# Patient Record
Sex: Male | Born: 1990
Health system: Southern US, Community
[De-identification: ages and names within clinical notes are randomized; demographics above are authoritative.]

## PROBLEM LIST (undated history)

## (undated) DIAGNOSIS — R011 Cardiac murmur, unspecified: Secondary | ICD-10-CM

## (undated) DIAGNOSIS — F988 Other specified behavioral and emotional disorders with onset usually occurring in childhood and adolescence: Secondary | ICD-10-CM

## (undated) DIAGNOSIS — F419 Anxiety disorder, unspecified: Secondary | ICD-10-CM

## (undated) HISTORY — PX: NO PAST SURGERIES: SHX2092

## (undated) HISTORY — DX: Other specified behavioral and emotional disorders with onset usually occurring in childhood and adolescence: F98.8

## (undated) HISTORY — PX: WISDOM TOOTH EXTRACTION: SHX21

## (undated) HISTORY — DX: Cardiac murmur, unspecified: R01.1

## (undated) HISTORY — DX: Anxiety disorder, unspecified: F41.9

---
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
## 2009-08-09 ENCOUNTER — Ambulatory Visit: Payer: Self-pay | Admitting: Family Medicine

## 2009-10-16 ENCOUNTER — Ambulatory Visit: Payer: Self-pay | Admitting: Internal Medicine

## 2009-11-15 ENCOUNTER — Telehealth (INDEPENDENT_AMBULATORY_CARE_PROVIDER_SITE_OTHER): Payer: Self-pay | Admitting: *Deleted

## 2010-01-09 ENCOUNTER — Telehealth (INDEPENDENT_AMBULATORY_CARE_PROVIDER_SITE_OTHER): Payer: Self-pay | Admitting: *Deleted

## 2010-02-13 ENCOUNTER — Telehealth (INDEPENDENT_AMBULATORY_CARE_PROVIDER_SITE_OTHER): Payer: Self-pay | Admitting: *Deleted

## 2010-02-14 ENCOUNTER — Telehealth: Payer: Self-pay | Admitting: Internal Medicine

## 2010-04-03 ENCOUNTER — Telehealth (INDEPENDENT_AMBULATORY_CARE_PROVIDER_SITE_OTHER): Payer: Self-pay | Admitting: *Deleted

## 2010-04-04 ENCOUNTER — Telehealth (INDEPENDENT_AMBULATORY_CARE_PROVIDER_SITE_OTHER): Payer: Self-pay | Admitting: *Deleted

## 2010-10-16 NOTE — Progress Notes (Signed)
Summary: ADDERRAL PRESCRIPTION  Phone Note Call from Patient Call back at [facility] [phone]   Caller: Patient Summary of Call: NEED ADDERRAL 20 ---MOM PATTY MORGAN WILL PICK UP FOR PATIENT  ADVISED IT WILL BE READY AFTER 3PM ON [facility] Initial call taken by: Jerolyn Shin,  April 03, 2010 2:51 PM  Follow-up for Phone Call        will place up front needs appt before additional refills. Army Fossa CMA  April 03, 2010 3:25 PM     Prescriptions: ADDERALL 10 MG TABS (AMPHETAMINE-DEXTROAMPHETAMINE) 2 by mouth two times a day  #0 x 0   Entered by:   Army Fossa CMA   Authorized by:   Nolon Rod. Paz MD   Signed by:   Army Fossa CMA on 04/03/2010   Method used:   Print then Give to Patient   RxID:   [id number]

## 2010-10-16 NOTE — Progress Notes (Signed)
Summary: Refill Request ADDERALL  Phone Note Refill Request Message from:  Patient on Feb 13, 2010 11:42 AM  Refills Requested: Medication #1:  ADDERALL 20 MG TABS 1 by mouth two times a day.   Dosage confirmed as above?Dosage Confirmed   Supply Requested: 1 month Call at [phone] when ready   Method Requested: Pick up at Office Next Appointment Scheduled: none Initial call taken by: Harold Barban,  Feb 13, 2010 11:42 AM  Follow-up for Phone Call        LAST OV 10/16/09, LAST REFILL #60 X 0 ON 01/10/10 .Kandice Hams  Feb 13, 2010 3:07 PM  Follow-up by: Kandice Hams,  Feb 13, 2010 3:07 PM  Additional Follow-up for Phone Call Additional follow up Details #1::        pt informed rx will be ready for pickup .Kandice Hams  Feb 13, 2010 3:18 PM  Additional Follow-up by: Kandice Hams,  Feb 13, 2010 3:18 PM    Prescriptions: ADDERALL 20 MG TABS (AMPHETAMINE-DEXTROAMPHETAMINE) 1 by mouth two times a day  #60 x 0   Entered by:   Kandice Hams   Authorized by:   Nolon Rod. Paz MD   Signed by:   Kandice Hams on 02/13/2010   Method used:   Print then Give to Patient   RxID:   [id number]

## 2010-10-16 NOTE — Progress Notes (Signed)
  Phone Note Call from Patient   Summary of Call: needs to rx for adderall, quanity was not on Rx. Army Fossa CMA  April 04, 2010 4:03 PM     Prescriptions: ADDERALL 10 MG TABS (AMPHETAMINE-DEXTROAMPHETAMINE) 2 by mouth two times a day  #30 x 0   Entered by:   Army Fossa CMA   Authorized by:   Nolon Rod. Paz MD   Signed by:   Army Fossa CMA on 04/04/2010   Method used:   Print then Give to Patient   RxID:   [id number]   Appended Document:  New quanity. Pt takes more than 30 takes 2 by two times a day.   Clinical Lists Changes  Medications: Rx of ADDERALL 10 MG TABS (AMPHETAMINE-DEXTROAMPHETAMINE) 2 by mouth two times a day;  #120 x 0;  Signed;  Entered by: Army Fossa CMA;  Authorized by: Nolon Rod Paz MD;  Method used: Print then Give to Patient    Prescriptions: ADDERALL 10 MG TABS (AMPHETAMINE-DEXTROAMPHETAMINE) 2 by mouth two times a day  #120 x 0   Entered by:   Army Fossa CMA   Authorized by:   Nolon Rod. Paz MD   Signed by:   Army Fossa CMA on 04/04/2010   Method used:   Print then Give to Patient   RxID:   [id number]

## 2010-10-16 NOTE — Progress Notes (Signed)
Summary: FYI ADDERALL 20 MG ON BACK ORDER  Phone Note From Pharmacy   Caller: Theodoro Grist from [facility] [phone] Summary of Call: Adderall 20mg  not available on back order  pt takes 20mg  1 po bid ;  can we change to 10mg  and change quantity and directions?  Spoke with pharmacist informed OK Adderall 10mg  2 by mouth two times a day #120  MED CHANGED IN MED LIST  Initial call taken by: Kandice Hams,  February 14, 2010 2:38 PM  Follow-up for Phone Call        agree [place] E. Deirdre Gryder MD  February 15, 2010 8:30 AM     New/Updated Medications: ADDERALL 10 MG TABS (AMPHETAMINE-DEXTROAMPHETAMINE) 2 by mouth two times a day

## 2010-10-16 NOTE — Progress Notes (Signed)
Summary: refill adderall  Phone Note Refill Request Call back at [phone] Message from:  Patient  Refills Requested: Medication #1:  ADDERALL 20 MG TABS 1 by mouth two times a day. please call when ready - patient mom will pick up  Next Appointment Scheduled: last appt [id number] - no appt scheduled Initial call taken by: Okey Regal [place],  January 09, 2010 4:13 PM    Prescriptions: ADDERALL 20 MG TABS (AMPHETAMINE-DEXTROAMPHETAMINE) 1 by mouth two times a day  #60 x 0   Entered by:   Shary Decamp   Authorized by:   Nolon Rod. Paz MD   Signed by:   Shary Decamp on 01/10/2010   Method used:   Print then Give to Patient   RxID:   [id number]

## 2010-10-16 NOTE — Progress Notes (Signed)
Summary: adderall rx  Phone Note Refill Request Call back at [phone] Message from:  Patient mom  Refills Requested: Medication #1:  ADDERALL 20 MG TABS 1 by mouth two times a day. last ov 10/16/09, last refill #60 x 0 on 10/16/09  Initial call taken by: Kandice Hams,  November 15, 2009 1:38 PM  Follow-up for Phone Call        pt mom informed rx will be ready for pickup today .Kandice Hams  November 15, 2009 1:46 PM  Follow-up by: Kandice Hams,  November 15, 2009 1:46 PM    Prescriptions: ADDERALL 20 MG TABS (AMPHETAMINE-DEXTROAMPHETAMINE) 1 by mouth two times a day  #60 x 0   Entered by:   Kandice Hams   Authorized by:   Nolon Rod. Paz MD   Signed by:   Kandice Hams on 11/15/2009   Method used:   Print then Give to Patient   RxID:   [id number]

## 2010-10-16 NOTE — Assessment & Plan Note (Signed)
Summary: fup on meds//ccm   Vital Signs:  Patient profile:   20 year old male Height:      69.5 inches Weight:      163.2 pounds BMI:     23.84 Pulse rate:   70 / minute BP sitting:   162 / 80  Vitals Entered By: Shary Decamp (October 16, 2009 2:04 PM) CC: rov   History of Present Illness: here for f/u doing well on Adderall some days takes two in the morning and one in the afternoon, other days  one p.o. b.i.d.  Current Medications (verified): 1)  Adderall 20 Mg Tabs (Amphetamine-Dextroamphetamine) .Marland Kitchen.. 1 By Mouth Two Times A Day  Allergies (verified): No Known Drug Allergies  Past History:  Past Medical History: ADD  Past Surgical History: no major   Social History: Single lives with mother, going to college at [employer] marijuana sometimes-- weekends  tobacco-- rarely  Review of Systems Psych:  Denies anxiety and depression; sleeps well for the most part .  Physical Exam  General:  alert, well-developed, and well-nourished.   Lungs:  Normal respiratory effort, chest expands symmetrically. Lungs are clear to auscultation, no crackles or wheezes. Heart:  Normal rate and regular rhythm. S1 and S2 normal without gallop, murmur, click, rub or other extra sounds. Psych:  Cognition and judgment appear intact. Alert and cooperative with normal attention span and concentration.  not anxious appearing and not depressed appearing.     Impression & Recommendations:  Problem # 1:  ADD (ICD-314.00) symptoms well controlled is okay to take two in the morning and one in the afternoon some days but at the same time I would like him to skip meds  on Sundays.  Hopefully 60 tabs will  last  a month but if he needs more,  he is to let me know Will call for prescription monthly, his mother could pick it up follow up in 6 months BP initially slightly elevated, BP repeated by me manually and it was 120/70  Complete Medication List: 1)  Adderall 20 Mg Tabs  (Amphetamine-dextroamphetamine) .Marland Kitchen.. 1 by mouth two times a day  Patient Instructions: 1)  Please schedule a follow-up appointment in 6 months .  Prescriptions: ADDERALL 20 MG TABS (AMPHETAMINE-DEXTROAMPHETAMINE) 1 by mouth two times a day  #60 x 0   Entered by:   Shary Decamp   Authorized by:   Nolon Rod. Khianna Blazina MD   Signed by:   Shary Decamp on 10/16/2009   Method used:   Print then Give to Patient   RxID:   [id number]
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
## 2012-10-02 ENCOUNTER — Encounter: Payer: Self-pay | Admitting: Internal Medicine

## 2012-10-02 ENCOUNTER — Ambulatory Visit (INDEPENDENT_AMBULATORY_CARE_PROVIDER_SITE_OTHER): Payer: Self-pay | Admitting: Internal Medicine

## 2012-10-02 ENCOUNTER — Encounter: Payer: Self-pay | Admitting: Lab

## 2012-10-02 VITALS — BP 122/78 | HR 66 | Temp 98.2°F | Ht 71.0 in | Wt 154.0 lb

## 2012-10-02 DIAGNOSIS — R011 Cardiac murmur, unspecified: Secondary | ICD-10-CM

## 2012-10-02 DIAGNOSIS — F988 Other specified behavioral and emotional disorders with onset usually occurring in childhood and adolescence: Secondary | ICD-10-CM

## 2012-10-02 DIAGNOSIS — F419 Anxiety disorder, unspecified: Secondary | ICD-10-CM

## 2012-10-02 DIAGNOSIS — F411 Generalized anxiety disorder: Secondary | ICD-10-CM

## 2012-10-02 HISTORY — DX: Anxiety disorder, unspecified: F41.9

## 2012-10-02 MED ORDER — CLONAZEPAM 0.5 MG PO TABS: 0.5000 mg | ORAL_TABLET | Freq: Three times a day (TID) | ORAL | Status: DC | PRN

## 2012-10-02 MED ORDER — AMPHETAMINE-DEXTROAMPHETAMINE 30 MG PO TABS: 30.0000 mg | ORAL_TABLET | Freq: Two times a day (BID) | ORAL | Status: DC

## 2012-10-02 NOTE — Assessment & Plan Note (Addendum)
Currently taking clonazepam 3 times a day with good control of symptoms, old records reviewed, that is indeed the dose  he was getting from previous provider. On return to the office, will discuss other medication such as SSRIs

## 2012-10-02 NOTE — Patient Instructions (Addendum)
Call for an Adderall refilled at least 5 days in advance. Next visit here 3-4 months

## 2012-10-02 NOTE — Assessment & Plan Note (Signed)
Workup in 2008 negative, I don't hear a murmur today.

## 2012-10-02 NOTE — Progress Notes (Signed)
  Subjective:    Patient ID: Darius Foster, male    DOB: 1991-01-13, 22 y.o.   MRN: [id number]  HPI Here to get reestablished, last visit about 3 years ago. Since the last time he was here, he graduated from college, currently studying in [place]. For the last 3 years, he has been under the care of off a nurse practitioner in his college, he was taking Adderall 30 mg twice a day, at some point dose was increased to 30 mg 3 times a day. With current dose symptoms are well-controlled. He also has developed anxiety, currently taking clonazepam 3 tablets daily. With current medicines, symptoms are well-controlled.  Past Medical History  Diagnosis Date  . ADD   . CARDIAC MURMUR     ECHO (-) 2008, saw cards "innocent murmur"  . Anxiety 10/02/2012   Past Surgical History  Procedure Date  . No past surgeries    History   Social History  . Marital Status: Single    Spouse Name: N/A    Number of Children: 0  . Years of Education: N/A   Occupational History  . student     Social History Main Topics  . Smoking status: Current Every Day Smoker  . Smokeless tobacco: Never Used  . Alcohol Use: No     Comment: socially   . Drug Use: No  . Sexually Active: Not on file   Other Topics Concern  . 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 No chest pain or shortness of breath Occasional difficulty with sleeping, usually melatonin OTC helps.     Objective:   Physical Exam General -- alert, well-developed, and well-nourished.   Neck --no thyromegaly  Lungs -- normal respiratory effort, no intercostal retractions, no accessory muscle use, and normal breath sounds.   Heart-- normal rate, regular rhythm, no murmur, and no gallop.   Extremities-- no pretibial edema bilaterally Neurologic-- alert & oriented X3 and strength normal in all extremities. Psych-- Cognition and judgment  appear intact. Alert and cooperative with normal attention span and concentration.  not anxious appearing and not depressed appearing.        Assessment & Plan:  Today , I spent more than 30  min with the patient,   counseling, and  Reviewing records from another provider

## 2012-10-02 NOTE — Assessment & Plan Note (Addendum)
Long history of ADHD, currently taking Adderall 30 mg 3 times a day. Notes from his previous provider reviewed, at some point the  other provider expressed concern about taking more than 60 mg daily. I concur with his concerns and explained to the pt I am willing to go up to 60 mg daily. Rec.  referral to psychiatry if he feels a higher dose is warranted. At this point he declined and we agreed to prescribe 30 mg twice a day. he will sign an agreement for controlled substances. Will call for refills, mom willpick up his prescription.

## 2012-10-04 ENCOUNTER — Encounter: Payer: Self-pay | Admitting: Internal Medicine

## 2012-11-10 ENCOUNTER — Telehealth: Payer: Self-pay | Admitting: Internal Medicine

## 2012-11-10 MED ORDER — AMPHETAMINE-DEXTROAMPHETAMINE 30 MG PO TABS: 30.0000 mg | ORAL_TABLET | Freq: Two times a day (BID) | ORAL | Status: DC

## 2012-11-10 NOTE — Telephone Encounter (Signed)
Pt called requesting rx for adderall. He states his mother is going to pick this up for him bc she will be visiting him in [place] tomorrow. Patient states he filled out forms for her to be able to pick up his rx, but I do not see this in his chart.

## 2012-11-10 NOTE — Telephone Encounter (Signed)
Done

## 2012-11-10 NOTE — Telephone Encounter (Signed)
Ok to refill? Last OV 1.17.14 Last filled 1.17.14 

## 2012-11-12 ENCOUNTER — Encounter: Payer: Self-pay | Admitting: Internal Medicine

## 2012-12-15 ENCOUNTER — Telehealth: Payer: Self-pay | Admitting: *Deleted

## 2012-12-15 ENCOUNTER — Telehealth: Payer: Self-pay | Admitting: Internal Medicine

## 2012-12-15 MED ORDER — AMPHETAMINE-DEXTROAMPHETAMINE 30 MG PO TABS: 30.0000 mg | ORAL_TABLET | Freq: Two times a day (BID) | ORAL | Status: DC

## 2012-12-15 MED ORDER — CLONAZEPAM 0.5 MG PO TABS: 0.5000 mg | ORAL_TABLET | Freq: Three times a day (TID) | ORAL | Status: DC | PRN

## 2012-12-15 NOTE — Telephone Encounter (Signed)
Refill done.  Pt made aware rx is ready to be picked up at front desk.

## 2012-12-15 NOTE — Telephone Encounter (Signed)
Patient called requesting rx for adderall 30 mg and clonazepam 0.5mg . Patient will come pick up both. CB# [phone]

## 2012-12-15 NOTE — Telephone Encounter (Signed)
Prior Auth approved 11-17-12-11-17-13, approval letter scan to chart

## 2013-01-14 ENCOUNTER — Telehealth: Payer: Self-pay | Admitting: Internal Medicine

## 2013-01-14 NOTE — Telephone Encounter (Signed)
OK to refill adderall 30mg ? Last OV 1.17.14 Last filled 4.1.14

## 2013-01-14 NOTE — Telephone Encounter (Signed)
Ok to refill? Last OV 1.17.14 Last filled 1.4.14  Clonazepam 0.5mg  was filled on 4.1.14 w/ 1 refill. Pt should not need a refill on this med.

## 2013-01-14 NOTE — Telephone Encounter (Signed)
Pt called to get a refill on his adderall medicine and clonazepam medicine. He would like to pick it up here. thanks

## 2013-01-14 NOTE — Telephone Encounter (Signed)
Agree, too early

## 2013-01-15 MED ORDER — AMPHETAMINE-DEXTROAMPHETAMINE 30 MG PO TABS: 30.0000 mg | ORAL_TABLET | Freq: Two times a day (BID) | ORAL | Status: DC

## 2013-01-15 NOTE — Telephone Encounter (Signed)
Pt made aware rx is ready to be picked up at front desk.  

## 2013-01-15 NOTE — Telephone Encounter (Signed)
ok 

## 2013-02-01 ENCOUNTER — Ambulatory Visit (INDEPENDENT_AMBULATORY_CARE_PROVIDER_SITE_OTHER): Payer: BC Managed Care – PPO | Admitting: Internal Medicine

## 2013-02-01 ENCOUNTER — Encounter: Payer: Self-pay | Admitting: Internal Medicine

## 2013-02-01 VITALS — BP 128/78 | HR 78 | Temp 98.7°F | Wt 152.0 lb

## 2013-02-01 DIAGNOSIS — F411 Generalized anxiety disorder: Secondary | ICD-10-CM

## 2013-02-01 DIAGNOSIS — F988 Other specified behavioral and emotional disorders with onset usually occurring in childhood and adolescence: Secondary | ICD-10-CM

## 2013-02-01 DIAGNOSIS — F419 Anxiety disorder, unspecified: Secondary | ICD-10-CM

## 2013-02-01 NOTE — Assessment & Plan Note (Signed)
Long history of anxiety described as panic attacks. Today we took about SSRIs, he does not recall taking such medications in the past. Currently symptoms are very well controlled, on average takes one clonazepam daily, sometimes needs  Additional 1 or 2 tablets. Since symptoms are  well controlled, we will continue with clonazepam for now.

## 2013-02-01 NOTE — Progress Notes (Signed)
  Subjective:    Patient ID: Darius Foster, male    DOB: [birth year], 22 y.o.   MRN: [id number]  HPI Routine office visit Good medication compliance, symptoms well controlled. He lives in [place] but is moving back to [place] for the summer where he got a job.  Past Medical History  Diagnosis Date  . ADD   . CARDIAC MURMUR     ECHO (-) 2008, saw cards "innocent murmur"  . Anxiety 10/02/2012    Past Surgical History  Procedure Laterality Date  . No past surgeries     History   Social History  . Marital Status: Single    Spouse Name: N/A    Number of Children: 0  . Years of Education: N/A   Occupational History  . student -- welding    Social History Main Topics  . Smoking status: Current Every Day Smoker  . Smokeless tobacco: Never Used  . Alcohol Use: No     Comment: socially   . Drug Use: No  . Sexually Active: Not on file   Other Topics Concern  . 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 Sleeps well. Has history of anxiety, panic attacks. Uses clonazepam daily one tablet, sometimes needs one or 2 additional tablets.    Objective:   Physical Exam BP 128/78  Pulse 78  Temp(Src) 98.7 F (37.1 C) (Oral)  Wt 152 lb (68.947 kg)  BMI 21.21 kg/m2  SpO2 97%  General -- alert, well-developed  Neurologic-- alert & oriented X3 and strength normal in all extremities. Psych-- Cognition and judgment appear intact. Alert and cooperative with normal attention span and concentration.  not anxious appearing and not depressed appearing.       Assessment & Plan:

## 2013-02-01 NOTE — Assessment & Plan Note (Signed)
Well-controlled, refill medications when necessary.

## 2013-02-17 ENCOUNTER — Telehealth: Payer: Self-pay | Admitting: Internal Medicine

## 2013-02-17 NOTE — Telephone Encounter (Signed)
Patient states okay for mother Rushie Goltz to pu Rx

## 2013-02-19 ENCOUNTER — Telehealth: Payer: Self-pay | Admitting: Internal Medicine

## 2013-02-19 MED ORDER — CLONAZEPAM 0.5 MG PO TABS: 0.5000 mg | ORAL_TABLET | Freq: Three times a day (TID) | ORAL | Status: DC | PRN

## 2013-02-19 MED ORDER — AMPHETAMINE-DEXTROAMPHETAMINE 30 MG PO TABS: 30.0000 mg | ORAL_TABLET | Freq: Two times a day (BID) | ORAL | Status: DC

## 2013-02-19 NOTE — Telephone Encounter (Signed)
Patient is calling to see when his refills of Adderall Rx and Clonazepam will be ready for pick-up. States that he is going out of town today and wanted to pick-up before leaving.

## 2013-02-19 NOTE — Telephone Encounter (Signed)
Pt made aware rx is ready to be picked up at front desk.  

## 2013-02-19 NOTE — Telephone Encounter (Signed)
Ok to refill? Last OV 5.19.14 Last filled 5.2.14

## 2013-02-19 NOTE — Telephone Encounter (Signed)
Done

## 2013-03-17 ENCOUNTER — Telehealth: Payer: Self-pay | Admitting: Internal Medicine

## 2013-03-17 MED ORDER — AMPHETAMINE-DEXTROAMPHETAMINE 30 MG PO TABS: 30.0000 mg | ORAL_TABLET | Freq: Two times a day (BID) | ORAL | Status: DC

## 2013-03-17 NOTE — Telephone Encounter (Signed)
Ok for #60, no refills 

## 2013-03-17 NOTE — Telephone Encounter (Signed)
Pt aware Rx ready for pick up 

## 2013-03-17 NOTE — Telephone Encounter (Signed)
Patient is calling in requesting a refill on his Adderall Rx. Please call when ready for pick up.

## 2013-03-17 NOTE — Telephone Encounter (Signed)
.  Last OV 02-01-13, Last refilled 02-19-13 #60

## 2013-04-14 ENCOUNTER — Telehealth: Payer: Self-pay | Admitting: Internal Medicine

## 2013-04-14 MED ORDER — AMPHETAMINE-DEXTROAMPHETAMINE 30 MG PO TABS: 30.0000 mg | ORAL_TABLET | Freq: Two times a day (BID) | ORAL | Status: DC

## 2013-04-14 NOTE — Telephone Encounter (Signed)
Pt called to get a refill on his two medication      amphetamine-dextroamphetamine (ADDERALL) 30 MG tablet   clonazePAM (KLONOPIN) 0.5 MG tablet  thanks

## 2013-04-14 NOTE — Telephone Encounter (Signed)
Got #90 and one refill clonazepam last month, on average he takes one tablet daily (Sometimes 2 or 3), It is too early to refill. Okay to refill Adderall. Please obtain a UDS

## 2013-04-14 NOTE — Telephone Encounter (Signed)
Ok to refill medications? Last OV 5.19.14 Adderall last filled by Dr. Beverely Low 7.2.14 #60 no refills Clonazepam last filled 6.6.14 #90 with 1 refill Contract on file Low Risk as of 1.17.14 Pt. Due for repeat UDS

## 2013-04-14 NOTE — Telephone Encounter (Signed)
Spoke with patient, made aware adderall rx ready for pickup. Verbalized understanding of clonazepam denial. Front desk made aware of need for UDS.

## 2013-04-20 ENCOUNTER — Telehealth: Payer: Self-pay | Admitting: Internal Medicine

## 2013-04-20 MED ORDER — CLONAZEPAM 0.5 MG PO TABS: 0.5000 mg | ORAL_TABLET | Freq: Three times a day (TID) | ORAL | Status: DC | PRN

## 2013-04-20 NOTE — Telephone Encounter (Signed)
Patient is out of his Clonazepam Rx. He says the 1 refill from his last prescription was used last month. Going out of town and would like to go pick up today.

## 2013-04-20 NOTE — Telephone Encounter (Signed)
Ok to refill clonazepam?  Last OV 5.19.14 Last filled 6.6.14 #90 with 1 refill Contract on file, low risk. Due for UDS

## 2013-04-20 NOTE — Telephone Encounter (Signed)
Please collect a  UDS at the time he picks up Clonazepam Rx  #90 no refills

## 2013-04-21 NOTE — Telephone Encounter (Addendum)
Prescribed clonazepam #90 and one refill 02/19/2013 Prescribe adderall 03/17/2013 UDS showed no clonazepam or Adderall 04/14/2013 He is high risk: Advise patient I won't be able to continue prescribing these medications.

## 2013-04-21 NOTE — Telephone Encounter (Signed)
Spoke with patient, he is currently in [place], [place] for school and is going to board a plane this afternoon to go on vacation to [place]. Wanted to know if we could send rx to [facility]. Please advise.

## 2013-04-21 NOTE — Telephone Encounter (Signed)
Pt. Made aware rx ready for pick up and front desk made aware of need for UDS.

## 2013-04-22 NOTE — Telephone Encounter (Signed)
Spoke with patient, explained information below. Pt. Verbalized understanding. rx shredded per protocol since MD will not be providing refills.

## 2013-04-27 ENCOUNTER — Telehealth: Payer: Self-pay | Admitting: *Deleted

## 2013-04-27 NOTE — Telephone Encounter (Signed)
Spoke with patients mother who is very upset that we will no longer prescribe patients Adderall and Klonopin. Explained to mother the UDS was negative for both drugs even though patient was receiving Rx's regularly. Due to that fact, we could no longer prescribe these medications. Patients mother became very angry stating "this is ridiculous, he probably just ran out of the medicine." Mother then demanded to speak with Dr. Drue Novel and I advised that he was seeing patients at this time and may not be able to respond to my message until after hours. Mother then went on to ask if her son signed anything that gave [place] permission to screen him and I affirmed that we did have a controlled substance agreement in place which outlines our expectations for all patients taking any controlled substance. Mother again stated "I will wait to hear from Dr. Drue Novel" and hung up. Mom is Lala Lund and cell # [phone]

## 2013-04-27 NOTE — Telephone Encounter (Signed)
Based on the UDS, I cannot continue prescribing medication. Not much I can do about it

## 2013-04-28 NOTE — Telephone Encounter (Signed)
Spoke with mother who is requesting to speak with Dr. Drue Novel personally, I advised she could make an appt WITH her son present to discuss this. Son must be present for appt due to HIPPA guidelines.

## 2013-05-05 ENCOUNTER — Encounter: Payer: Self-pay | Admitting: Internal Medicine

## 2018-08-26 DIAGNOSIS — Z5181 Encounter for therapeutic drug level monitoring: Secondary | ICD-10-CM | POA: Diagnosis not present

## 2018-08-26 DIAGNOSIS — F419 Anxiety disorder, unspecified: Secondary | ICD-10-CM | POA: Diagnosis not present

## 2018-08-26 DIAGNOSIS — F909 Attention-deficit hyperactivity disorder, unspecified type: Secondary | ICD-10-CM | POA: Diagnosis not present

## 2018-12-02 DIAGNOSIS — Z1329 Encounter for screening for other suspected endocrine disorder: Secondary | ICD-10-CM | POA: Diagnosis not present

## 2018-12-02 DIAGNOSIS — Z0110 Encounter for examination of ears and hearing without abnormal findings: Secondary | ICD-10-CM | POA: Diagnosis not present

## 2018-12-02 DIAGNOSIS — F909 Attention-deficit hyperactivity disorder, unspecified type: Secondary | ICD-10-CM | POA: Diagnosis not present

## 2018-12-02 DIAGNOSIS — F419 Anxiety disorder, unspecified: Secondary | ICD-10-CM | POA: Diagnosis not present

## 2018-12-02 DIAGNOSIS — Z131 Encounter for screening for diabetes mellitus: Secondary | ICD-10-CM | POA: Diagnosis not present

## 2018-12-02 DIAGNOSIS — Z0000 Encounter for general adult medical examination without abnormal findings: Secondary | ICD-10-CM | POA: Diagnosis not present

## 2019-02-11 DIAGNOSIS — F419 Anxiety disorder, unspecified: Secondary | ICD-10-CM | POA: Diagnosis not present

## 2019-02-11 DIAGNOSIS — F909 Attention-deficit hyperactivity disorder, unspecified type: Secondary | ICD-10-CM | POA: Diagnosis not present

## 2019-04-16 ENCOUNTER — Telehealth: Payer: Self-pay

## 2019-04-16 NOTE — Telephone Encounter (Signed)
Questions for Screening COVID-19  Symptom onset: no  Travel or Contacts:on  During this illness, did/does the patient experience any of the following symptoms? Fever >100.76F []   Yes [x]   No []   Unknown Subjective fever (felt feverish) []   Yes [x]   No []   Unknown Chills []   Yes [x]   No []   Unknown Muscle aches (myalgia) []   Yes [x]   No []   Unknown Runny nose (rhinorrhea) []   Yes [x]   No []   Unknown Sore throat []   Yes [x]   No []   Unknown Cough (new onset or worsening of chronic cough) []   Yes [x]   No []   Unknown Shortness of breath (dyspnea) []   Yes [x]   No []   Unknown Nausea or vomiting []   Yes [x]   No []   Unknown Headache []   Yes [x]   No []   Unknown Abdominal pain  []   Yes [x]   No []   Unknown Diarrhea (?3 loose/looser than normal stools/24hr period) []   Yes [x]   No []   Unknown Other, specify:  Patient risk factors: Smoker? []   Current []   Former []   Never If male, currently pregnant? []   Yes []   No  Patient Active Problem List   Diagnosis Date Noted  . Anxiety 10/02/2012  . ADD 03/26/2007  . CARDIAC MURMUR 03/26/2007    Plan:  []   High risk for COVID-19 with red flags go to ED (with CP, SOB, weak/lightheaded, or fever > 101.5). Call ahead.  []   High risk for COVID-19 but stable. Inform provider and coordinate time for [facility] visit.   []   No red flags but URI signs or symptoms okay for [facility] visit.

## 2019-04-19 ENCOUNTER — Encounter: Payer: Self-pay | Admitting: Family Medicine

## 2019-04-19 ENCOUNTER — Ambulatory Visit (INDEPENDENT_AMBULATORY_CARE_PROVIDER_SITE_OTHER): Payer: BC Managed Care – PPO | Admitting: Family Medicine

## 2019-04-19 VITALS — BP 150/80 | HR 93 | Temp 98.6°F | Ht 70.0 in | Wt 165.2 lb

## 2019-04-19 DIAGNOSIS — F988 Other specified behavioral and emotional disorders with onset usually occurring in childhood and adolescence: Secondary | ICD-10-CM

## 2019-04-19 DIAGNOSIS — F419 Anxiety disorder, unspecified: Secondary | ICD-10-CM | POA: Diagnosis not present

## 2019-04-19 DIAGNOSIS — Z79899 Other long term (current) drug therapy: Secondary | ICD-10-CM

## 2019-04-19 MED ORDER — AMPHETAMINE-DEXTROAMPHETAMINE 30 MG PO TABS: 30.0000 mg | ORAL_TABLET | Freq: Two times a day (BID) | ORAL | 0 refills | Status: DC

## 2019-04-19 NOTE — Progress Notes (Signed)
Darius Foster - 28 y.o. male MRN [id number]  Date of birth: April 24, 1991  Subjective Chief Complaint  Patient presents with  . Establish Care    est care/ med refills-- Adderall 30 mg BID    HPI Darius Foster is a 28 y.o. male with history of anxiety/panic attacks and ADD here today establish care.  He has been treated for several years for these conditions.  He was last seen by Dr. Vista Lawman.  He reports current medications are working well for him.  He take adderall 30mg  bid and clonazepam 1mg  BID prn for panic/anxiety.   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  He denies side effects from medication including insomnia or worsening anxiety with adderall or oversedation with clonazepam.    ROS:  A comprehensive ROS was completed and negative except as noted per HPI  No Known Allergies  Past Medical History:  Diagnosis Date  . ADD   . Anxiety 10/02/2012  . CARDIAC MURMUR    ECHO (-) 2008, saw cards "innocent murmur"    Past Surgical History:  Procedure Laterality Date  . NO PAST SURGERIES    . WISDOM TOOTH EXTRACTION      Social History   Socioeconomic History  . Marital status: Single    Spouse name: Not on file  . Number of children: 0  . Years of education: Not on file  . Highest education level: Not on file  Occupational History  . Occupation: Ship broker -- Lobbyist  Social Needs  . Financial resource strain: Not on file  . Food insecurity    Worry: Not on file    Inability: Not on file  . Transportation needs    Medical: Not on file    Non-medical: Not on file  Tobacco Use  . Smoking status: Former Smoker    Quit date: 2019    Years since quitting: 1.5  . Smokeless tobacco: Never Used  Substance and Sexual Activity  . Alcohol use: Yes    Comment: socially   . Drug use: No  . Sexual activity: Not on file  Lifestyle  . Physical activity    Days per week: Not on file    Minutes per session: Not on file  . Stress: Not on file  Relationships  .  Social Herbalist on phone: Not on file    Gets together: Not on file    Attends religious service: Not on file    Active member of club or organization: Not on file    Attends meetings of clubs or organizations: Not on file    Relationship status: Not on file  Other Topics Concern  . Not on file  Social History Narrative   Graduated from [employer] (Art major) and going to welding school    History reviewed. No pertinent family history.  Health Maintenance  Topic Date Due  . HIV Screening  11/26/2003  . INFLUENZA VACCINE  04/17/2019  . TETANUS/TDAP  07/14/2024    ----------------------------------------------------------------------------------------------------------------------------------------------------------------------------------------------------------------- Physical Exam BP (!) 150/80   Pulse 93   Temp 98.6 F (37 C) (Oral)   Ht 5\' 10"  (1.778 m)   Wt 165 lb 3.2 oz (74.9 kg)   SpO2 96%   BMI 23.70 kg/m   Physical Exam Constitutional:      Appearance: Normal appearance.  HENT:     Head: Normocephalic and atraumatic.     Mouth/Throat:     Mouth: Mucous membranes are moist.  Eyes:  General: No scleral icterus. Neck:     Musculoskeletal: Neck supple.  Cardiovascular:     Rate and Rhythm: Normal rate and regular rhythm.  Pulmonary:     Effort: Pulmonary effort is normal.     Breath sounds: Normal breath sounds.  Skin:    General: Skin is warm and dry.  Neurological:     General: No focal deficit present.     Mental Status: He is alert.  Psychiatric:        Mood and Affect: Mood normal.        Behavior: Behavior normal.     ------------------------------------------------------------------------------------------------------------------------------------------------------------------------------------------------------------------- Assessment and Plan  Anxiety -Stable symptoms with current dose of clonazepam.  Denies side effects,  will continue for now.  Will discuss possibly trying SSRI at f/u.    Attention deficit disorder -Stable with current dose of adderall, will plan to continue.  -PDMP reviewed and consistent with reported fill dates.  -Controlled medication agreement signed and UDS ordered today.

## 2019-04-19 NOTE — Assessment & Plan Note (Signed)
-  Stable symptoms with current dose of clonazepam.  Denies side effects, will continue for now.  Will discuss possibly trying SSRI at f/u.

## 2019-04-19 NOTE — Assessment & Plan Note (Signed)
-  Stable with current dose of adderall, will plan to continue.  -PDMP reviewed and consistent with reported fill dates.  -Controlled medication agreement signed and UDS ordered today.

## 2019-04-19 NOTE — Patient Instructions (Signed)
Great to meet you! Please have records from previous PCP sent to our office.  I will see you back in 3 months.

## 2019-04-21 LAB — TOXASSURE SELECT 13 (MW), URINE

## 2019-05-19 ENCOUNTER — Telehealth: Payer: Self-pay | Admitting: Family Medicine

## 2019-05-19 ENCOUNTER — Other Ambulatory Visit: Payer: Self-pay | Admitting: Family Medicine

## 2019-05-19 MED ORDER — AMPHETAMINE-DEXTROAMPHETAMINE 30 MG PO TABS: 30.0000 mg | ORAL_TABLET | Freq: Two times a day (BID) | ORAL | 0 refills | Status: DC

## 2019-05-19 NOTE — Telephone Encounter (Signed)
REFILL amphetamine-dextroamphetamine (ADDERALL) 30 MG tablet  PHARMACY [facility] [phone] - [place], [place] AT [address] [place] [phone] (Phone) [phone] (Fax)

## 2019-05-19 NOTE — Telephone Encounter (Signed)
I left pt a voicemail letting him know the Rx has been sent to the pharmacy.

## 2019-05-19 NOTE — Telephone Encounter (Signed)
Completed.

## 2019-06-17 ENCOUNTER — Other Ambulatory Visit: Payer: Self-pay | Admitting: Family Medicine

## 2019-06-17 NOTE — Telephone Encounter (Signed)
Pt is calling and pharm always tell him to call his doctor office. Pt needs generic adderall 30 mg and clonazepam . [facility] [address]

## 2019-06-21 NOTE — Telephone Encounter (Signed)
Pt following up on refill request, as he has been out of meds since 10/03.

## 2019-06-22 MED ORDER — AMPHETAMINE-DEXTROAMPHETAMINE 30 MG PO TABS: 30.0000 mg | ORAL_TABLET | Freq: Two times a day (BID) | ORAL | 0 refills | Status: DC

## 2019-06-22 MED ORDER — CLONAZEPAM 0.5 MG PO TABS: 0.5000 mg | ORAL_TABLET | Freq: Three times a day (TID) | ORAL | 0 refills | Status: DC | PRN

## 2019-07-19 ENCOUNTER — Telehealth: Payer: Self-pay

## 2019-07-19 NOTE — Telephone Encounter (Signed)

## 2019-07-20 ENCOUNTER — Other Ambulatory Visit: Payer: Self-pay

## 2019-07-20 ENCOUNTER — Encounter: Payer: Self-pay | Admitting: Family Medicine

## 2019-07-20 ENCOUNTER — Ambulatory Visit (INDEPENDENT_AMBULATORY_CARE_PROVIDER_SITE_OTHER): Payer: BC Managed Care – PPO | Admitting: Family Medicine

## 2019-07-20 DIAGNOSIS — F900 Attention-deficit hyperactivity disorder, predominantly inattentive type: Secondary | ICD-10-CM | POA: Diagnosis not present

## 2019-07-20 DIAGNOSIS — F419 Anxiety disorder, unspecified: Secondary | ICD-10-CM | POA: Diagnosis not present

## 2019-07-20 MED ORDER — AMPHETAMINE-DEXTROAMPHETAMINE 30 MG PO TABS: 30.0000 mg | ORAL_TABLET | Freq: Two times a day (BID) | ORAL | 0 refills | Status: DC

## 2019-07-20 NOTE — Assessment & Plan Note (Signed)
-  Continues to do well with current dose of clonazepam.  Will continue for the time being.  He will consider trying SSRI again in the future.

## 2019-07-20 NOTE — Assessment & Plan Note (Signed)
-  Stable with current dose of adderall.  Denies side effects.  Continue at current dose.

## 2019-07-20 NOTE — Progress Notes (Signed)
Darius Foster - 28 y.o. male MRN [id number]  Date of birth: Dec 12, 1990  Subjective Chief Complaint  Patient presents with  . Follow-up    discuss meds & anxiety.    HPI Darius Foster is a 28 y.o. male here today for follow up of ADD and anxiety.  He reports he is doing well at this time.  He has been on current dose of adderall for several years.  Current dose continues to work well for him.  He denies side effects from medication including worsening anxiety, palpitations, insomnia, or significant appetite change.  His anxiety remains well controlled with current dose of clonazepam as needed.  He typically takes this 1-2x per day.  He has tried SSRI in the past but wasn't very effective in managing his anxiety.    ROS:  A comprehensive ROS was completed and negative except as noted per HPI  No Known Allergies  Past Medical History:  Diagnosis Date  . ADD   . Anxiety 10/02/2012  . CARDIAC MURMUR    ECHO (-) 2008, saw cards "innocent murmur"    Past Surgical History:  Procedure Laterality Date  . NO PAST SURGERIES    . WISDOM TOOTH EXTRACTION      Social History   Socioeconomic History  . Marital status: Single    Spouse name: Not on file  . Number of children: 0  . Years of education: Not on file  . Highest education level: Not on file  Occupational History  . Occupation: Ship broker -- Lobbyist  Social Needs  . Financial resource strain: Not on file  . Food insecurity    Worry: Not on file    Inability: Not on file  . Transportation needs    Medical: Not on file    Non-medical: Not on file  Tobacco Use  . Smoking status: Former Smoker    Quit date: 2019    Years since quitting: 1.8  . Smokeless tobacco: Never Used  Substance and Sexual Activity  . Alcohol use: Yes    Comment: socially   . Drug use: No  . Sexual activity: Not on file  Lifestyle  . Physical activity    Days per week: Not on file    Minutes per session: Not on file  . Stress: Not on file   Relationships  . Social Herbalist on phone: Not on file    Gets together: Not on file    Attends religious service: Not on file    Active member of club or organization: Not on file    Attends meetings of clubs or organizations: Not on file    Relationship status: Not on file  Other Topics Concern  . Not on file  Social History Narrative   Graduated from [employer] (Art major) and going to welding school    History reviewed. No pertinent family history.  Health Maintenance  Topic Date Due  . HIV Screening  11/26/2003  . TETANUS/TDAP  07/14/2024  . INFLUENZA VACCINE  Completed    ----------------------------------------------------------------------------------------------------------------------------------------------------------------------------------------------------------------- Physical Exam BP 138/64   Pulse 88   Temp 98.9 F (37.2 C) (Temporal)   Ht 5\' 10"  (1.778 m)   Wt 166 lb 9.6 oz (75.6 kg)   SpO2 95%   BMI 23.90 kg/m   Physical Exam Constitutional:      Appearance: Normal appearance.  HENT:     Head: Normocephalic and atraumatic.     Mouth/Throat:     Mouth: Mucous membranes  are moist.  Eyes:     General: No scleral icterus. Neck:     Musculoskeletal: Neck supple.  Cardiovascular:     Rate and Rhythm: Normal rate and regular rhythm.  Pulmonary:     Effort: Pulmonary effort is normal.     Breath sounds: Normal breath sounds.  Skin:    General: Skin is warm and dry.     Findings: No rash.  Neurological:     General: No focal deficit present.     Mental Status: He is alert.  Psychiatric:        Mood and Affect: Mood normal.        Behavior: Behavior normal.     ------------------------------------------------------------------------------------------------------------------------------------------------------------------------------------------------------------------- Assessment and Plan  Anxiety -Continues to do well with  current dose of clonazepam.  Will continue for the time being.  He will consider trying SSRI again in the future.   Attention deficit disorder -Stable with current dose of adderall.  Denies side effects.  Continue at current dose.

## 2019-07-21 ENCOUNTER — Other Ambulatory Visit: Payer: Self-pay | Admitting: Family Medicine

## 2019-07-21 ENCOUNTER — Telehealth: Payer: Self-pay | Admitting: Family Medicine

## 2019-07-21 MED ORDER — CLONAZEPAM 0.5 MG PO TABS: 0.5000 mg | ORAL_TABLET | Freq: Two times a day (BID) | ORAL | 2 refills | Status: DC | PRN

## 2019-07-21 NOTE — Telephone Encounter (Signed)
RX REFILL clonazePAM (KLONOPIN) 0.5 MG tablet  PHARMACY [facility] [id number] [place], [address] AT [place] [id number] phone [phone] fax

## 2019-07-26 ENCOUNTER — Other Ambulatory Visit: Payer: Self-pay

## 2019-07-26 DIAGNOSIS — Z20822 Contact with and (suspected) exposure to covid-19: Secondary | ICD-10-CM

## 2019-07-28 LAB — NOVEL CORONAVIRUS, NAA: SARS-CoV-2, NAA: DETECTED — AB

## 2019-08-18 ENCOUNTER — Other Ambulatory Visit: Payer: Self-pay | Admitting: Family Medicine

## 2019-08-18 NOTE — Telephone Encounter (Signed)
Medication Refill - Medication:      amphetamine-dextroamphetamine (ADDERALL) 30 MG tablet    clonazePAM (KLONOPIN) 0.5 MG tablet        Preferred Pharmacy (with phone number or street name):  [facility] RO:[id number], [place] [phone] (Phone) [phone] (Fax)     Agent: Please be advised that RX refills may take up to 3 business days. We ask that you follow-up with your pharmacy.

## 2019-08-18 NOTE — Telephone Encounter (Signed)
Requested medication (s) are due for refill today: yes  Requested medication (s) are on the active medication list: yes  Last refill:  07/20/2019  Future visit scheduled: yes  Notes to clinic:  Refill cannot be delegated    Requested Prescriptions  Pending Prescriptions Disp Refills   amphetamine-dextroamphetamine (ADDERALL) 30 MG tablet 60 tablet 0    Sig: Take 1 tablet by mouth 2 (two) times daily.     Not Delegated - Psychiatry:  Stimulants/ADHD Failed - 08/18/2019  2:49 PM      Failed - This refill cannot be delegated      Failed - Urine Drug Screen completed in last 360 days.      Passed - Valid encounter within last 3 months    Recent Outpatient Visits          4 weeks ago Anxiety   [facility] [place], [place], DO   4 months ago Attention deficit disorder, unspecified hyperactivity presence   [facility] [place], [place], [place]      Future Appointments            In 2 months Cir[place], Garvin Fila, DO [facility] [place]            clonazePAM (KLONOPIN) 0.5 MG tablet 60 tablet 2    Sig: Take 1 tablet (0.5 mg total) by mouth 2 (two) times daily as needed.     Not Delegated - Psychiatry:  Anxiolytics/Hypnotics Failed - 08/18/2019  2:49 PM      Failed - This refill cannot be delegated      Failed - Urine Drug Screen completed in last 360 days.      Passed - Valid encounter within last 6 months    Recent Outpatient Visits          4 weeks ago Anxiety   [facility] [place], [place], DO   4 months ago Attention deficit disorder, unspecified hyperactivity presence   [facility] [place], [place], DO      Future Appointments            In 2 months [place], Garvin Fila, DO [facility]

## 2019-08-19 MED ORDER — AMPHETAMINE-DEXTROAMPHETAMINE 30 MG PO TABS: 30.0000 mg | ORAL_TABLET | Freq: Two times a day (BID) | ORAL | 0 refills | Status: DC

## 2019-08-19 MED ORDER — CLONAZEPAM 0.5 MG PO TABS: 0.5000 mg | ORAL_TABLET | Freq: Two times a day (BID) | ORAL | 2 refills | Status: DC | PRN

## 2019-09-22 ENCOUNTER — Telehealth: Payer: Self-pay | Admitting: Family Medicine

## 2019-09-22 NOTE — Telephone Encounter (Signed)
Patient is requesting refill request for adderall and klonopin. Please call patient at [phone] once medications have been sent in.

## 2019-09-23 ENCOUNTER — Other Ambulatory Visit: Payer: Self-pay | Admitting: Family Medicine

## 2019-09-23 MED ORDER — AMPHETAMINE-DEXTROAMPHETAMINE 30 MG PO TABS: 30.0000 mg | ORAL_TABLET | Freq: Two times a day (BID) | ORAL | 0 refills | Status: DC

## 2019-09-23 NOTE — Telephone Encounter (Signed)
There should be additional refills on the clonazepam.  Sent in 12/3 with 2 refills.  Adderall has been refilled.

## 2019-10-19 DIAGNOSIS — Z20828 Contact with and (suspected) exposure to other viral communicable diseases: Secondary | ICD-10-CM | POA: Diagnosis not present

## 2019-10-20 ENCOUNTER — Telehealth (INDEPENDENT_AMBULATORY_CARE_PROVIDER_SITE_OTHER): Payer: Self-pay | Admitting: Family Medicine

## 2019-10-20 ENCOUNTER — Encounter: Payer: Self-pay | Admitting: Family Medicine

## 2019-10-20 VITALS — Temp 97.7°F | Ht 70.0 in | Wt 165.0 lb

## 2019-10-20 DIAGNOSIS — F900 Attention-deficit hyperactivity disorder, predominantly inattentive type: Secondary | ICD-10-CM

## 2019-10-20 DIAGNOSIS — F419 Anxiety disorder, unspecified: Secondary | ICD-10-CM

## 2019-10-20 MED ORDER — CLONAZEPAM 0.5 MG PO TABS: 0.5000 mg | ORAL_TABLET | Freq: Two times a day (BID) | ORAL | 2 refills | Status: DC | PRN

## 2019-10-20 MED ORDER — AMPHETAMINE-DEXTROAMPHETAMINE 30 MG PO TABS: 30.0000 mg | ORAL_TABLET | Freq: Two times a day (BID) | ORAL | 0 refills | Status: DC

## 2019-10-20 NOTE — Progress Notes (Signed)
Virtual Visit via Video Note  I connected with Darius Foster on 10/20/19 at  3:00 PM EST by a video enabled telemedicine application and verified that I am speaking with the correct person using two identifiers. Location patient: home Location provider: work  Persons participating in the virtual visit: patient, provider  I discussed the limitations of evaluation and management by telemedicine and the availability of in person appointments. The patient expressed understanding and agreed to proceed.  Chief Complaint  Patient presents with  . Establish Care     HPI: Darius Foster is a 29 y.o. male who was previously a patient of Dr. Zigmund Daniel who is seen today to f/u on anxiety and ADD and get medication refills.  He has been on Adderall 30mg  BID x 10 years. He has been on Adderall since 29yo.  He also takes klonopin 0.5mg  BID x 10 years. He states he was on a higher dose in the past. He states he was tried on other meds in the past that did not work or he has side effects with. He does not sleep well and takes > 10mg  of melatonin some nights. He states he would be agreeable to taking klonopin 3x/day but is not asking for that today. He denies side effects from medication including insomnia or worsening anxiety with adderall or oversedation with clonazepam.     Past Medical History:  Diagnosis Date  . ADD   . Anxiety 10/02/2012  . CARDIAC MURMUR    ECHO (-) 2008, saw cards "innocent murmur"    Past Surgical History:  Procedure Laterality Date  . NO PAST SURGERIES    . WISDOM TOOTH EXTRACTION      History reviewed. No pertinent family history.  Social History   Tobacco Use  . Smoking status: Former Smoker    Quit date: 2019    Years since quitting: 2.0  . Smokeless tobacco: Never Used  Substance Use Topics  . Alcohol use: Yes    Comment: socially   . Drug use: No     Current Outpatient Medications:  .  amphetamine-dextroamphetamine (ADDERALL) 30 MG tablet, Take 1  tablet by mouth 2 (two) times daily., Disp: 60 tablet, Rfl: 0 .  clonazePAM (KLONOPIN) 0.5 MG tablet, Take 1 tablet (0.5 mg total) by mouth 2 (two) times daily as needed., Disp: 60 tablet, Rfl: 2  No Known Allergies    ROS: See pertinent positives and negatives per HPI.   EXAM:  VITALS per patient if applicable: Temp 0000000 F (36.5 C) (Oral)   Ht 5\' 10"  (1.778 m)   Wt 165 lb (74.8 kg)   BMI 23.68 kg/m    GENERAL: alert, oriented, appears well and in no acute distress  HEENT: atraumatic, conjunctiva clear, no obvious abnormalities on inspection of external nose and ears  NECK: normal movements of the head and neck  LUNGS: on inspection no signs of respiratory distress, breathing rate appears normal, no obvious gross SOB, gasping or wheezing, no conversational dyspnea  CV: no obvious cyanosis  PSYCH/NEURO: pleasant and cooperative, speech and thought processing grossly intact   ASSESSMENT AND PLAN: 1. Attention deficit hyperactivity disorder (ADHD), predominantly inattentive type - stable - UDS done in 04/2019 - PMP reviewed and appropriate Refill: - amphetamine-dextroamphetamine (ADDERALL) 30 MG tablet; Take 1 tablet by mouth 2 (two) times daily.  Dispense: 60 tablet; Refill: 0 - f/u in 3 mo or sooner PRN - VV or in-person visit ok  2. Anxiety - overall stable and controlled -  sleep is an issue for pt and he would like to take klonopin 0.5mg  TID but states it would be "unprofessional" to ask for that today. We discussed other sleep med options but pt states he will cont with current med/dose as is - UDS done in 04/2019 - PMP reviewed and appropriate Refill: - clonazePAM (KLONOPIN) 0.5 MG tablet; Take 1 tablet (0.5 mg total) by mouth 2 (two) times daily as needed.  Dispense: 60 tablet; Refill: 2 - f/u in 3 mo or sooner PRN - VV or in-person visit ok   I discussed the assessment and treatment plan with the patient. The patient was provided an opportunity to ask questions  and all were answered. The patient agreed with the plan and demonstrated an understanding of the instructions.   The patient was advised to call back or seek an in-person evaluation if the symptoms worsen or if the condition fails to improve as anticipated.   Letta Median, DO

## 2019-10-20 NOTE — Patient Instructions (Signed)
Health Maintenance Due  Topic Date Due  . HIV Screening  11/26/2003    Depression screen PHQ 2/9 04/19/2019  Decreased Interest 0  Down, Depressed, Hopeless 0  PHQ - 2 Score 0

## 2019-11-17 ENCOUNTER — Telehealth: Payer: Self-pay | Admitting: Family Medicine

## 2019-11-17 DIAGNOSIS — F900 Attention-deficit hyperactivity disorder, predominantly inattentive type: Secondary | ICD-10-CM

## 2019-11-17 NOTE — Telephone Encounter (Signed)
Last vv 10/20/19 Last fill for both 10/20/19  #60/0 and #60/2

## 2019-11-17 NOTE — Telephone Encounter (Signed)
Patient is calling and requesting a refill for Adderall and Clonazepam sent to [facility] on [address]. CB is [phone]

## 2019-11-18 MED ORDER — AMPHETAMINE-DEXTROAMPHETAMINE 30 MG PO TABS: 30.0000 mg | ORAL_TABLET | Freq: Two times a day (BID) | ORAL | 0 refills | Status: DC

## 2019-11-18 NOTE — Telephone Encounter (Signed)
adderall refilled. Klonopin was ordered on 10/20/19 #60 with 2RF so pt should still have 2 refills on the Rx

## 2019-11-18 NOTE — Telephone Encounter (Signed)
Pt aware.

## 2019-11-22 ENCOUNTER — Telehealth: Payer: Self-pay | Admitting: Family Medicine

## 2019-11-22 NOTE — Telephone Encounter (Signed)
I spoke w/pt and he scheduled an appointment for Dr. Loletha Grayer to take a look at moles.

## 2019-11-22 NOTE — Telephone Encounter (Signed)
Patient is calling and requesting to speak to someone regarding getting a referral to see a dermatologist for moles he has on his back. Pls advise. CB is [phone]

## 2019-11-25 ENCOUNTER — Other Ambulatory Visit: Payer: Self-pay

## 2019-11-26 ENCOUNTER — Encounter: Payer: Self-pay | Admitting: Family Medicine

## 2019-11-26 ENCOUNTER — Ambulatory Visit (INDEPENDENT_AMBULATORY_CARE_PROVIDER_SITE_OTHER): Payer: BC Managed Care – PPO | Admitting: Family Medicine

## 2019-11-26 VITALS — BP 140/86 | HR 89 | Temp 97.8°F | Ht 70.0 in | Wt 177.8 lb

## 2019-11-26 DIAGNOSIS — D229 Melanocytic nevi, unspecified: Secondary | ICD-10-CM

## 2019-11-26 DIAGNOSIS — B360 Pityriasis versicolor: Secondary | ICD-10-CM | POA: Diagnosis not present

## 2019-11-26 NOTE — Patient Instructions (Signed)
  [facility] [address] [place], [place] [address] [phone] [email or web address]

## 2019-11-26 NOTE — Progress Notes (Signed)
Darius Foster is a 29 y.o. male  Chief Complaint  Patient presents with  . spots  on back    Pt noticed that a spot on his back has changed    HPI: Darius Foster is a 29 y.o. male who complains of moles on his back that he feels has changed recently. Pts mother mentioned to him that 2 of them have changed color and look darker.     Past Medical History:  Diagnosis Date  . ADD   . Anxiety 10/02/2012  . CARDIAC MURMUR    ECHO (-) 2008, saw cards "innocent murmur"    Past Surgical History:  Procedure Laterality Date  . NO PAST SURGERIES    . WISDOM TOOTH EXTRACTION      Social History   Socioeconomic History  . Marital status: Single    Spouse name: Not on file  . Number of children: 0  . Years of education: Not on file  . Highest education level: Not on file  Occupational History  . Occupation: student -- welding  Tobacco Use  . Smoking status: Former Smoker    Quit date: 2019    Years since quitting: 2.1  . Smokeless tobacco: Never Used  Substance and Sexual Activity  . Alcohol use: Yes    Comment: socially   . Drug use: No  . Sexual activity: Not on file  Other Topics Concern  . 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 Difficulty of Paying Living Expenses:   Food Insecurity:   . Worried About Charity fundraiser in the Last Year:   . Arboriculturist in the Last Year:   Transportation Needs:   . Film/video editor (Medical):   Marland Kitchen Lack of Transportation (Non-Medical):   Physical Activity:   . Days of Exercise per Week:   . Minutes of Exercise per Session:   Stress:   . Feeling of Stress :   Social Connections:   . Frequency of Communication with Friends and Family:   . Frequency of Social Gatherings with Friends and Family:   . Attends Religious Services:   . Active Member of Clubs or Organizations:   .  Attends Archivist Meetings:   Marland Kitchen Marital Status:   Intimate Partner Violence:   . Fear of Current or Ex-Partner:   . Emotionally Abused:   Marland Kitchen Physically Abused:   . Sexually Abused:     History reviewed. No pertinent family history.   Immunization History  Administered Date(s) Administered  . DTP 02/03/1989, 03/27/1989, 05/22/1989, 06/08/1990, 01/22/1995  . Hepatitis A 03/26/2007  . Hepatitis B 04/08/1996, 06/04/1996, 10/20/1996  . HiB (PRP-OMP) 09/24/1989, 11/26/1989, 06/08/1990  . Influenza, Seasonal, Injecte, Preservative Fre 07/14/2014  . MMR 06/08/1990, 01/22/1995  . Meningococcal Polysaccharide 05/07/2005  . OPV 02/03/1989, 03/27/1989, 06/08/1990, 01/22/1995  . Td 09/16/1992, 05/07/2005  . Tdap 07/14/2014    Outpatient Encounter Medications as of 11/26/2019  Medication Sig  . amphetamine-dextroamphetamine (ADDERALL) 30 MG tablet Take 1 tablet by mouth 2 (two) times daily.  . clonazePAM (KLONOPIN) 0.5 MG tablet Take 1 tablet (0.5 mg total) by mouth 2 (two) times daily as needed.   No facility-administered encounter medications on file as of 11/26/2019.     ROS: Pertinent positives and negatives noted in HPI. Remainder of ROS non-contributory   No  Known Allergies  BP 140/86 (BP Location: Left Arm, Patient Position: Sitting, Cuff Size: Normal)   Pulse 89   Temp 97.8 F (36.6 C) (Temporal)   Ht 5' 10"  (1.778 m)   Wt 177 lb 12.8 oz (80.6 kg)   SpO2 96%   BMI 25.51 kg/m   BP Readings from Last 3 Encounters:  11/26/19 140/86  07/20/19 138/64  04/19/19 (!) 150/80     Physical Exam  Constitutional: He is oriented to person, place, and time. He appears well-developed and well-nourished. No distress.  Pulmonary/Chest: No respiratory distress.  Neurological: He is alert and oriented to person, place, and time.  Skin: Skin is warm and dry.     Psychiatric: He has a normal mood and affect.     A/P:  1. Tinea versicolor - chronic issue - pt has used  topical antifungal in the past to treat tinea versicolor but typically uses selson blue shampoo as a wash on regular basis - Ambulatory referral to Dermatology  2. Atypical nevi - Ambulatory referral to Dermatology - mild-moderate h/o sun exposure  This visit occurred during the SARS-CoV-2 public health emergency.  Safety protocols were in place, including screening questions prior to the visit, additional usage of staff PPE, and extensive cleaning of exam room while observing appropriate contact time as indicated for disinfecting solutions.

## 2019-12-16 ENCOUNTER — Telehealth: Payer: Self-pay | Admitting: Family Medicine

## 2019-12-16 DIAGNOSIS — F900 Attention-deficit hyperactivity disorder, predominantly inattentive type: Secondary | ICD-10-CM

## 2019-12-16 NOTE — Telephone Encounter (Signed)
Patient is calling for a refill for Adderall sent to [facility] on [address]. CB is [phone]

## 2019-12-20 MED ORDER — AMPHETAMINE-DEXTROAMPHETAMINE 30 MG PO TABS: 30.0000 mg | ORAL_TABLET | Freq: Two times a day (BID) | ORAL | 0 refills | Status: DC

## 2019-12-20 NOTE — Telephone Encounter (Signed)
Last OV 11/26/19 Last fill 11/18/19  #60/0

## 2020-01-13 ENCOUNTER — Other Ambulatory Visit: Payer: Self-pay

## 2020-01-13 NOTE — Patient Instructions (Signed)
Health Maintenance Due  Topic Date Due  . HIV Screening  Never done  . COVID-19 Vaccine (1) Never done    Depression screen [facility] 2/9 04/19/2019  Decreased Interest 0  Down, Depressed, Hopeless 0  PHQ - 2 Score 0

## 2020-01-14 ENCOUNTER — Telehealth (INDEPENDENT_AMBULATORY_CARE_PROVIDER_SITE_OTHER): Payer: BC Managed Care – PPO | Admitting: Family Medicine

## 2020-01-14 ENCOUNTER — Encounter: Payer: Self-pay | Admitting: Family Medicine

## 2020-01-14 VITALS — Ht 70.0 in

## 2020-01-14 DIAGNOSIS — F419 Anxiety disorder, unspecified: Secondary | ICD-10-CM

## 2020-01-14 DIAGNOSIS — F900 Attention-deficit hyperactivity disorder, predominantly inattentive type: Secondary | ICD-10-CM

## 2020-01-14 MED ORDER — CLONAZEPAM 0.5 MG PO TABS: 0.5000 mg | ORAL_TABLET | Freq: Two times a day (BID) | ORAL | 2 refills | Status: DC | PRN

## 2020-01-14 MED ORDER — AMPHETAMINE-DEXTROAMPHETAMINE 30 MG PO TABS: 30.0000 mg | ORAL_TABLET | Freq: Two times a day (BID) | ORAL | 0 refills | Status: DC

## 2020-01-14 NOTE — Progress Notes (Signed)
Virtual Visit via Video Note  I connected with Darius Foster on 01/14/20 at  3:30 PM EDT by a video enabled telemedicine application and verified that I am speaking with the correct person using two identifiers. Location patient: home Location provider: or home office Persons participating in the virtual visit: patient, provider  I discussed the limitations of evaluation and management by telemedicine and the availability of in person appointments. The patient expressed understanding and agreed to proceed.  Chief Complaint  Patient presents with  . Follow-up    3 month      HPI: Darius Foster is a 29 y.o. male seen today for routine f/u on ADD and anxiety and medication refill of Adderall 30mg  BID and klonopin 0.5mg  BID PRN. Pt has been working more, staying active, sleeping well.  Side effects - none Effective - yes Sleep - good Appetite - good Pt denies HA, vision changes, dizziness, CP, SOB, palpitations.    Past Medical History:  Diagnosis Date  . ADD   . Anxiety 10/02/2012  . CARDIAC MURMUR    ECHO (-) 2008, saw cards "innocent murmur"    Past Surgical History:  Procedure Laterality Date  . NO PAST SURGERIES    . WISDOM TOOTH EXTRACTION      History reviewed. No pertinent family history.  Social History   Tobacco Use  . Smoking status: Former Smoker    Quit date: 2019    Years since quitting: 2.3  . Smokeless tobacco: Never Used  Substance Use Topics  . Alcohol use: Yes    Comment: socially   . Drug use: No     Current Outpatient Medications:  .  clonazePAM (KLONOPIN) 0.5 MG tablet, Take 1 tablet (0.5 mg total) by mouth 2 (two) times daily as needed., Disp: 60 tablet, Rfl: 2 .  amphetamine-dextroamphetamine (ADDERALL) 30 MG tablet, Take 1 tablet by mouth 2 (two) times daily., Disp: 60 tablet, Rfl: 0  No Known Allergies    ROS: See pertinent positives and negatives per HPI.   EXAM:  VITALS per patient if applicable: Ht 5\' 10"  (1.778 m)    BMI 25.51 kg/m    GENERAL: alert, oriented, appears well and in no acute distress  HEENT: atraumatic, conjunctiva clear, no obvious abnormalities on inspection of external nose and ears  NECK: normal movements of the head and neck  LUNGS: on inspection no signs of respiratory distress, breathing rate appears normal, no obvious gross SOB, gasping or wheezing, no conversational dyspnea  CV: no obvious cyanosis  PSYCH/NEURO: pleasant and cooperative, no obvious depression or anxiety, speech and thought processing grossly intact   ASSESSMENT AND PLAN:  1. Attention deficit hyperactivity disorder (ADHD), predominantly inattentive type - stable, controlled - database reviewed and appropriate - UDS and controlled substance agreement UTD Refill: - amphetamine-dextroamphetamine (ADDERALL) 30 MG tablet; Take 1 tablet by mouth 2 (two) times daily.  Dispense: 60 tablet; Refill: 0 - f/u in 3 mo or sooner PRN  2. Anxiety - stable, controlled - database reviewed and appropriate - UDS and controlled substance agreement UTD Refill: - clonazePAM (KLONOPIN) 0.5 MG tablet; Take 1 tablet (0.5 mg total) by mouth 2 (two) times daily as needed.  Dispense: 60 tablet; Refill: 2 - f/u in 3 mo or sooner PRN   I discussed the assessment and treatment plan with the patient. The patient was provided an opportunity to ask questions and all were answered. The patient agreed with the plan and demonstrated an understanding of the instructions.  The patient was advised to call back or seek an in-person evaluation if the symptoms worsen or if the condition fails to improve as anticipated.   Letta Median, DO

## 2020-02-15 ENCOUNTER — Telehealth: Payer: Self-pay | Admitting: Family Medicine

## 2020-02-15 NOTE — Telephone Encounter (Signed)
Patient is calling and requesting a refill for Adderall sent  To [facility] on [address]. CB is [phone]

## 2020-02-16 ENCOUNTER — Other Ambulatory Visit: Payer: Self-pay

## 2020-02-16 DIAGNOSIS — F900 Attention-deficit hyperactivity disorder, predominantly inattentive type: Secondary | ICD-10-CM

## 2020-02-16 MED ORDER — AMPHETAMINE-DEXTROAMPHETAMINE 30 MG PO TABS: 30.0000 mg | ORAL_TABLET | Freq: Two times a day (BID) | ORAL | 0 refills | Status: DC

## 2020-02-16 NOTE — Telephone Encounter (Signed)
Last VV 01/14/20 Last fill 01/14/20  #60/0

## 2020-02-16 NOTE — Telephone Encounter (Signed)
Request sent to Dr. C. 

## 2020-03-16 ENCOUNTER — Other Ambulatory Visit: Payer: Self-pay

## 2020-03-16 ENCOUNTER — Telehealth: Payer: Self-pay | Admitting: Family Medicine

## 2020-03-16 DIAGNOSIS — F900 Attention-deficit hyperactivity disorder, predominantly inattentive type: Secondary | ICD-10-CM

## 2020-03-16 MED ORDER — AMPHETAMINE-DEXTROAMPHETAMINE 30 MG PO TABS: 30.0000 mg | ORAL_TABLET | Freq: Two times a day (BID) | ORAL | 0 refills | Status: DC

## 2020-03-16 NOTE — Telephone Encounter (Signed)
Dr. Loletha Grayer out of the office patient calling for refill on pending medication. Last visit 01/14/20. Please advise

## 2020-03-16 NOTE — Telephone Encounter (Addendum)
Patient is calling and requesting a refill for Adderall sent to [facility] on [address].  CB is [phone]

## 2020-03-21 ENCOUNTER — Other Ambulatory Visit: Payer: Self-pay

## 2020-03-21 DIAGNOSIS — F900 Attention-deficit hyperactivity disorder, predominantly inattentive type: Secondary | ICD-10-CM

## 2020-03-21 MED ORDER — AMPHETAMINE-DEXTROAMPHETAMINE 30 MG PO TABS: 30.0000 mg | ORAL_TABLET | Freq: Two times a day (BID) | ORAL | 0 refills | Status: DC

## 2020-03-21 NOTE — Telephone Encounter (Signed)
Sent refill request to Dr. Loletha Grayer today.

## 2020-03-21 NOTE — Telephone Encounter (Signed)
Patient called back and stated that he only received half of his medication refill request and is requesting the other half be sent to [facility] on [address]. CB is [phone]

## 2020-03-21 NOTE — Telephone Encounter (Signed)
Last fill 03/22/20  #30/0 per Dr. Ethelene Hal

## 2020-03-21 NOTE — Telephone Encounter (Signed)
rx refilled.

## 2020-05-01 ENCOUNTER — Telehealth: Payer: Self-pay | Admitting: Family Medicine

## 2020-05-01 DIAGNOSIS — F419 Anxiety disorder, unspecified: Secondary | ICD-10-CM

## 2020-05-01 DIAGNOSIS — F900 Attention-deficit hyperactivity disorder, predominantly inattentive type: Secondary | ICD-10-CM

## 2020-05-01 NOTE — Telephone Encounter (Signed)
Patient is calling and requesting a refill for klonopin and adderall sent to [facility], please advise. CB is [phone]

## 2020-05-01 NOTE — Telephone Encounter (Signed)
Darius Foster please advise as doc of the day.  Pt is requesting refills for:  Klonopin 60 tabs   0-refills Last fill-03/21/20 Last OV- Video Visit-01/14/2020  Adderall 60 tabs   2-refills Last filled- 01/14/2020 Last OV-VV-01/14/2020  I couldn't get the PMP site to pull up.

## 2020-05-02 MED ORDER — AMPHETAMINE-DEXTROAMPHETAMINE 30 MG PO TABS: 30.0000 mg | ORAL_TABLET | Freq: Two times a day (BID) | ORAL | 0 refills | Status: DC

## 2020-05-02 MED ORDER — CLONAZEPAM 0.5 MG PO TABS: 0.5000 mg | ORAL_TABLET | Freq: Two times a day (BID) | ORAL | 0 refills | Status: DC | PRN

## 2020-05-02 NOTE — Telephone Encounter (Signed)
PMP database reviewed: Adderall 30mg  last filled 03/31/2020, #60 Klonopin 0.5mg  last filled 07/01/202, #60.  With upcoming appt with Dr. Loletha Grayer on 05/05/2020, I provided 6tabs of klonopin and 8tabs of adderall. Additional refills will be provided by Dr. Loletha Grayer after upcoming appt.

## 2020-05-02 NOTE — Telephone Encounter (Signed)
Pt notified an verbally understood to pick up Rx.

## 2020-05-04 ENCOUNTER — Other Ambulatory Visit: Payer: Self-pay

## 2020-05-05 ENCOUNTER — Encounter: Payer: Self-pay | Admitting: Family Medicine

## 2020-05-05 ENCOUNTER — Ambulatory Visit (INDEPENDENT_AMBULATORY_CARE_PROVIDER_SITE_OTHER): Payer: BC Managed Care – PPO | Admitting: Family Medicine

## 2020-05-05 VITALS — BP 128/88 | HR 95 | Temp 98.4°F | Ht 70.0 in | Wt 165.4 lb

## 2020-05-05 DIAGNOSIS — F419 Anxiety disorder, unspecified: Secondary | ICD-10-CM

## 2020-05-05 DIAGNOSIS — F900 Attention-deficit hyperactivity disorder, predominantly inattentive type: Secondary | ICD-10-CM | POA: Diagnosis not present

## 2020-05-05 DIAGNOSIS — N529 Male erectile dysfunction, unspecified: Secondary | ICD-10-CM | POA: Diagnosis not present

## 2020-05-05 DIAGNOSIS — Z79899 Other long term (current) drug therapy: Secondary | ICD-10-CM

## 2020-05-05 MED ORDER — AMPHETAMINE-DEXTROAMPHETAMINE 30 MG PO TABS: 30.0000 mg | ORAL_TABLET | Freq: Two times a day (BID) | ORAL | 0 refills | Status: DC

## 2020-05-05 MED ORDER — SILDENAFIL CITRATE 50 MG PO TABS: 50.0000 mg | ORAL_TABLET | Freq: Every day | ORAL | 1 refills | Status: DC | PRN

## 2020-05-05 MED ORDER — CLONAZEPAM 0.5 MG PO TABS: 0.5000 mg | ORAL_TABLET | Freq: Two times a day (BID) | ORAL | 2 refills | Status: DC | PRN

## 2020-05-05 NOTE — Progress Notes (Signed)
Darius Foster is a 29 y.o. male  Chief Complaint  Patient presents with  . Follow-up    3 month for medications ADD//pt signed agreement    HPI: Darius Foster is a 29 y.o. male here for routine f/u on ADHD and anxiety and medication refills. Pt is currently taking Adderall 79m BID and klonopin 0.518mBID.  Symptoms controlled - yes Side effects - no Sleep - baseline Appetite - good Pt denies HA, vision changes, dizziness, palpitations, CP, numbness and tingling.  Pt complains of difficulty maintaining an erection and erections are not as firm as in the past. He is involved in a romantic relationship currently, but had not been d/t focus on work for the past 5-6 years. He denies having this issue in the past. Denies increased stress/anxiety/fatigue. No decrease in muscle mass, exercise tolerance.  Past Medical History:  Diagnosis Date  . ADD   . Anxiety 10/02/2012  . CARDIAC MURMUR    ECHO (-) 2008, saw cards "innocent murmur"    Past Surgical History:  Procedure Laterality Date  . NO PAST SURGERIES    . WISDOM TOOTH EXTRACTION      Social History   Socioeconomic History  . Marital status: Single    Spouse name: Not on file  . Number of children: 0  . Years of education: Not on file  . Highest education level: Not on file  Occupational History  . Occupation: student -- welding  Tobacco Use  . Smoking status: Former Smoker    Quit date: 2019    Years since quitting: 2.6  . Smokeless tobacco: Never Used  Substance and Sexual Activity  . Alcohol use: Yes    Comment: socially   . Drug use: No  . Sexual activity: Not on file  Other Topics Concern  . Not on file  Social History Narrative   Graduated from [employer] and going to welding school   Social Determinants of Health   Financial Resource Strain:   . Difficulty of Paying Living Expenses: Not on file  Food Insecurity:   . Worried About RuCharity fundraisern the Last Year: Not on file   . Ran Out of Food in the Last Year: Not on file  Transportation Needs:   . Lack of Transportation (Medical): Not on file  . Lack of Transportation (Non-Medical): Not on file  Physical Activity:   . Days of Exercise per Week: Not on file  . Minutes of Exercise per Session: Not on file  Stress:   . Feeling of Stress : Not on file  Social Connections:   . Frequency of Communication with Friends and Family: Not on file  . Frequency of Social Gatherings with Friends and Family: Not on file  . Attends Religious Services: Not on file  . Active Member of Clubs or Organizations: Not on file  . Attends ClArchivisteetings: Not on file  . Marital Status: Not on file  Intimate Partner Violence:   . Fear of Current or Ex-Partner: Not on file  . Emotionally Abused: Not on file  . Physically Abused: Not on file  . Sexually Abused: Not on file    History reviewed. No pertinent family history.   Immunization History  Administered Date(s) Administered  . DTP 02/03/1989, 03/27/1989, 05/22/1989, 06/08/1990, 01/22/1995  . Hepatitis A 03/26/2007  . Hepatitis B 04/08/1996, 06/04/1996, 10/20/1996  . HiB (PRP-OMP) 09/24/1989, 11/26/1989, 06/08/1990  . Influenza, Seasonal, Injecte, Preservative Fre 07/14/2014  .  MMR 06/08/1990, 01/22/1995  . Meningococcal Polysaccharide 05/07/2005  . OPV 02/03/1989, 03/27/1989, 06/08/1990, 01/22/1995  . Td 09/16/1992, 05/07/2005  . Tdap 07/14/2014    Outpatient Encounter Medications as of 05/05/2020  Medication Sig  . amphetamine-dextroamphetamine (ADDERALL) 30 MG tablet Take 1 tablet by mouth 2 (two) times daily. Additional refills from pcp  . clonazePAM (KLONOPIN) 0.5 MG tablet Take 1 tablet (0.5 mg total) by mouth 2 (two) times daily as needed. Additional refill from pcp   No facility-administered encounter medications on file as of 05/05/2020.     ROS: Pertinent positives and negatives noted in HPI. Remainder of ROS non-contributory    No  Known Allergies  BP 128/88   Pulse 95   Temp 98.4 F (36.9 C) (Tympanic)   Ht 5' 10"  (1.778 m)   Wt 165 lb 6.4 oz (75 kg)   SpO2 97%   BMI 23.73 kg/m    BP Readings from Last 3 Encounters:  05/05/20 128/88  11/26/19 140/86  07/20/19 138/64     Physical Exam Constitutional:      General: He is not in acute distress.    Appearance: Normal appearance. He is not ill-appearing.  Cardiovascular:     Rate and Rhythm: Normal rate and regular rhythm.     Pulses: Normal pulses.  Pulmonary:     Effort: Pulmonary effort is normal. No respiratory distress.     Breath sounds: Normal breath sounds. No wheezing or rhonchi.  Neurological:     General: No focal deficit present.     Mental Status: He is alert and oriented to person, place, and time. Mental status is at baseline.  Psychiatric:        Mood and Affect: Mood normal.        Behavior: Behavior normal.      A/P:  1. Attention deficit hyperactivity disorder (ADHD), predominantly inattentive type - stable, controlled - database reviewed and appropriate - controlled substance agreement signed today - DRUG MONITORING, PANEL 8 WITH CONFIRMATION, URINE Refill: - amphetamine-dextroamphetamine (ADDERALL) 30 MG tablet; Take 1 tablet by mouth 2 (two) times daily.  Dispense: 60 tablet; Refill: 0 -f/u q42mo- VV ok  2. Anxiety - stable, controlled - database reviewed and appropriate - controlled substance agreement signed today - DRUG MONITORING, PANEL 8 WITH CONFIRMATION, URINE - clonazePAM (KLONOPIN) 0.5 MG tablet; Take 1 tablet (0.5 mg total) by mouth 2 (two) times daily as needed.  Dispense: 60 tablet; Refill: 2 -f/u q310mo VV ok  3. High risk medications (not anticoagulants) long-term use - DRUG MONITORING, PANEL 8 WITH CONFIRMATION, URINE  4. Erectile dysfunction, unspecified erectile dysfunction type - trial of med, pt declines testosterone level due to cost/high deductible Rx: - sildenafil (VIAGRA) 50 MG tablet; Take  1 tablet (50 mg total) by mouth daily as needed for erectile dysfunction.  Dispense: 10 tablet; Refill: 1  This visit occurred during the SARS-CoV-2 public health emergency.  Safety protocols were in place, including screening questions prior to the visit, additional usage of staff PPE, and extensive cleaning of exam room while observing appropriate contact time as indicated for disinfecting solutions.

## 2020-05-07 LAB — DRUG MONITORING, PANEL 8 WITH CONFIRMATION, URINE
6 Acetylmorphine: NEGATIVE ng/mL (ref <10)
Alcohol Metabolites: POSITIVE ng/mL — AB
Amphetamine: 6734 ng/mL — ABNORMAL HIGH (ref <250)
Amphetamines: POSITIVE ng/mL — AB (ref <500)
Benzodiazepines: NEGATIVE ng/mL (ref <100)
Buprenorphine, Urine: NEGATIVE ng/mL (ref <5)
Cocaine Metabolite: NEGATIVE ng/mL (ref <150)
Creatinine: 71.5 mg/dL
Ethyl Glucuronide (ETG): 38137 ng/mL — ABNORMAL HIGH (ref <500)
Ethyl Sulfate (ETS): 15374 ng/mL — ABNORMAL HIGH (ref <100)
MDMA: NEGATIVE ng/mL (ref <500)
Marijuana Metabolite: NEGATIVE ng/mL (ref <20)
Methamphetamine: NEGATIVE ng/mL (ref <250)
Opiates: NEGATIVE ng/mL (ref <100)
Oxidant: NEGATIVE ug/mL
Oxycodone: NEGATIVE ng/mL (ref <100)
pH: 5.6 (ref 4.5–9.0)

## 2020-05-07 LAB — DM TEMPLATE

## 2020-05-10 ENCOUNTER — Encounter: Payer: Self-pay | Admitting: Family Medicine

## 2020-05-10 DIAGNOSIS — N529 Male erectile dysfunction, unspecified: Secondary | ICD-10-CM

## 2020-06-05 ENCOUNTER — Telehealth: Payer: Self-pay | Admitting: Family Medicine

## 2020-06-05 DIAGNOSIS — F900 Attention-deficit hyperactivity disorder, predominantly inattentive type: Secondary | ICD-10-CM

## 2020-06-05 MED ORDER — AMPHETAMINE-DEXTROAMPHETAMINE 30 MG PO TABS: 30.0000 mg | ORAL_TABLET | Freq: Two times a day (BID) | ORAL | 0 refills | Status: DC

## 2020-06-05 NOTE — Telephone Encounter (Signed)
Pt was notified and verbally understood to pick up his Rx from his pharmacy.

## 2020-06-05 NOTE — Telephone Encounter (Signed)
[place] per doc of the day, please advise. Pt requesting refills for:  Adderall 60 tabs   0-refills Last filled-05/05/2020 Last OV-05/05/2020 PMP-not working no supervisor  Pt requesting this sent to [facility] on [address].

## 2020-06-05 NOTE — Telephone Encounter (Signed)
Patient is calling and requesting a refill for Adderall sent to [facility] on [address], please advise. CB is [phone].

## 2020-06-05 NOTE — Telephone Encounter (Signed)
sent 

## 2020-06-30 DIAGNOSIS — Z20822 Contact with and (suspected) exposure to covid-19: Secondary | ICD-10-CM | POA: Diagnosis not present

## 2020-07-05 ENCOUNTER — Telehealth: Payer: Self-pay | Admitting: Family Medicine

## 2020-07-05 DIAGNOSIS — F900 Attention-deficit hyperactivity disorder, predominantly inattentive type: Secondary | ICD-10-CM

## 2020-07-05 MED ORDER — AMPHETAMINE-DEXTROAMPHETAMINE 30 MG PO TABS: 30.0000 mg | ORAL_TABLET | Freq: Two times a day (BID) | ORAL | 0 refills | Status: DC

## 2020-07-05 NOTE — Telephone Encounter (Signed)
Last OV 05/05/20 Last fill 06/05/20  #60/0

## 2020-07-05 NOTE — Telephone Encounter (Signed)
Patient is calling and requesting a refill for Adderrall to be sent to [facility] on [address], please advise. CB is [phone]

## 2020-07-05 NOTE — Telephone Encounter (Signed)
Informed pt via Mychart

## 2020-07-05 NOTE — Telephone Encounter (Signed)
rx refilled to pharm

## 2020-07-15 ENCOUNTER — Other Ambulatory Visit: Payer: Self-pay | Admitting: Family Medicine

## 2020-07-15 DIAGNOSIS — N529 Male erectile dysfunction, unspecified: Secondary | ICD-10-CM

## 2020-07-17 NOTE — Telephone Encounter (Signed)
Please see message and advise.  Thank you. Last V 05/05/20 Last fill 05/05/20  #10/1

## 2020-08-04 ENCOUNTER — Telehealth (INDEPENDENT_AMBULATORY_CARE_PROVIDER_SITE_OTHER): Payer: BC Managed Care – PPO | Admitting: Family Medicine

## 2020-08-04 ENCOUNTER — Encounter: Payer: Self-pay | Admitting: Family Medicine

## 2020-08-04 VITALS — Ht 70.0 in | Wt 155.0 lb

## 2020-08-04 DIAGNOSIS — F900 Attention-deficit hyperactivity disorder, predominantly inattentive type: Secondary | ICD-10-CM

## 2020-08-04 DIAGNOSIS — F419 Anxiety disorder, unspecified: Secondary | ICD-10-CM | POA: Diagnosis not present

## 2020-08-04 MED ORDER — AMPHETAMINE-DEXTROAMPHETAMINE 30 MG PO TABS: 30.0000 mg | ORAL_TABLET | Freq: Two times a day (BID) | ORAL | 0 refills | Status: DC

## 2020-08-04 MED ORDER — CLONAZEPAM 0.5 MG PO TABS: 0.5000 mg | ORAL_TABLET | Freq: Two times a day (BID) | ORAL | 2 refills | Status: DC | PRN

## 2020-08-04 NOTE — Progress Notes (Signed)
Virtual Visit via Video Note  I connected with Darius Foster on 08/04/20 at  4:00 PM EST by a video enabled telemedicine application and verified that I am speaking with the correct person using two identifiers. Location patient: home Location provider: work  Persons participating in the virtual visit: patient, provider  I discussed the limitations of evaluation and management by telemedicine and the availability of in person appointments. The patient expressed understanding and agreed to proceed.  Chief Complaint  Patient presents with  . Follow-up    f/u ADHD/anxiety and med refills.       HPI: Darius Foster is a 29 y.o. male seen today for routine f/u on ADD and anxiety as well as medication refills. For ADD pt is currently taking Adderall 30mg  daily. For anxiety pt is taking klonopin 0.5mg  BID PRN. Meds are effective. No side effects. Sleep is ok - at times sleep is disrupted d/t work. Appetite is "healthy"    Past Medical History:  Diagnosis Date  . ADD   . Anxiety 10/02/2012  . CARDIAC MURMUR    ECHO (-) 2008, saw cards "innocent murmur"    Past Surgical History:  Procedure Laterality Date  . NO PAST SURGERIES    . WISDOM TOOTH EXTRACTION      No family history on file.  Social History   Tobacco Use  . Smoking status: Former Smoker    Quit date: 2019    Years since quitting: 2.8  . Smokeless tobacco: Never Used  Substance Use Topics  . Alcohol use: Yes    Comment: socially   . Drug use: No     Current Outpatient Medications:  .  sildenafil (VIAGRA) 50 MG tablet, TAKE 1 TABLET(50 MG) BY MOUTH DAILY AS NEEDED FOR ERECTILE DYSFUNCTION, Disp: 10 tablet, Rfl: 5 .  amphetamine-dextroamphetamine (ADDERALL) 30 MG tablet, Take 1 tablet by mouth 2 (two) times daily., Disp: 60 tablet, Rfl: 0 .  clonazePAM (KLONOPIN) 0.5 MG tablet, Take 1 tablet (0.5 mg total) by mouth 2 (two) times daily as needed., Disp: 60 tablet, Rfl: 2  No Known Allergies    ROS: See  pertinent positives and negatives per HPI.   EXAM:  VITALS per patient if applicable: Ht 5\' 10"  (1.778 m)   Wt 155 lb (70.3 kg) Comment: pt reported  BMI 22.24 kg/m    GENERAL: alert, oriented, appears well and in no acute distress  HEENT: atraumatic, conjunctiva clear, no obvious abnormalities on inspection of external nose and ears  NECK: normal movements of the head and neck  LUNGS: on inspection no signs of respiratory distress, breathing rate appears normal, no obvious gross SOB, gasping or wheezing, no conversational dyspnea  CV: no obvious cyanosis  PSYCH/NEURO: pleasant and cooperative, no obvious depression or anxiety, speech and thought processing grossly intact   ASSESSMENT AND PLAN:  1. Attention deficit hyperactivity disorder (ADHD), predominantly inattentive type - controlled, at baseline - UDS and controlled substance agreement UTD (04/2020)  - database reviewed and appropriate - f/u in 3 mo or sooner PRN Refill: - amphetamine-dextroamphetamine (ADDERALL) 30 MG tablet; Take 1 tablet by mouth 2 (two) times daily.  Dispense: 60 tablet; Refill: 0  2. Anxiety - controlled, at baseline - UDS and controlled substance agreement UTD (04/2020)  - database reviewed and appropriate - f/u in 3 mo or sooner PRN Refill: - clonazePAM (KLONOPIN) 0.5 MG tablet; Take 1 tablet (0.5 mg total) by mouth 2 (two) times daily as needed.  Dispense: 60 tablet;  Refill: 2     I discussed the assessment and treatment plan with the patient. The patient was provided an opportunity to ask questions and all were answered. The patient agreed with the plan and demonstrated an understanding of the instructions.   The patient was advised to call back or seek an in-person evaluation if the symptoms worsen or if the condition fails to improve as anticipated.   Letta Median, DO

## 2020-09-05 ENCOUNTER — Telehealth: Payer: Self-pay

## 2020-09-05 DIAGNOSIS — F900 Attention-deficit hyperactivity disorder, predominantly inattentive type: Secondary | ICD-10-CM

## 2020-09-05 NOTE — Telephone Encounter (Signed)
Last VV 08/04/20 Last fill 08/04/20  #60/0

## 2020-09-06 MED ORDER — AMPHETAMINE-DEXTROAMPHETAMINE 30 MG PO TABS: 30.0000 mg | ORAL_TABLET | Freq: Two times a day (BID) | ORAL | 0 refills | Status: DC

## 2020-09-06 NOTE — Telephone Encounter (Signed)
Refill sent.

## 2020-10-04 ENCOUNTER — Encounter: Payer: Self-pay | Admitting: Family Medicine

## 2020-10-04 ENCOUNTER — Telehealth (INDEPENDENT_AMBULATORY_CARE_PROVIDER_SITE_OTHER): Payer: BC Managed Care – PPO | Admitting: Family Medicine

## 2020-10-04 VITALS — Ht 70.0 in | Wt 160.0 lb

## 2020-10-04 DIAGNOSIS — Z2821 Immunization not carried out because of patient refusal: Secondary | ICD-10-CM | POA: Diagnosis not present

## 2020-10-04 DIAGNOSIS — F900 Attention-deficit hyperactivity disorder, predominantly inattentive type: Secondary | ICD-10-CM

## 2020-10-04 DIAGNOSIS — F419 Anxiety disorder, unspecified: Secondary | ICD-10-CM | POA: Diagnosis not present

## 2020-10-04 MED ORDER — AMPHETAMINE-DEXTROAMPHETAMINE 30 MG PO TABS: 30.0000 mg | ORAL_TABLET | Freq: Two times a day (BID) | ORAL | 0 refills | Status: DC

## 2020-10-04 MED ORDER — CLONAZEPAM 0.5 MG PO TABS: 0.5000 mg | ORAL_TABLET | Freq: Two times a day (BID) | ORAL | 2 refills | Status: DC | PRN

## 2020-10-04 NOTE — Progress Notes (Signed)
Virtual Visit via Video Note  I connected with Darius Foster on 10/04/20 at  3:30 PM EST by a video enabled telemedicine application and verified that I am speaking with the correct person using two identifiers. Location patient: home Location provider: work  Persons participating in the virtual visit: patient, provider  I discussed the limitations of evaluation and management by telemedicine and the availability of in person appointments. The patient expressed understanding and agreed to proceed.  Interactive audio and video telecommunications were attempted between myself and the patient, however failed, due to the patient having technical difficulties.  We continued and completed the visit with audio only.   Chief Complaint  Patient presents with  . Follow-up    3 month f/u ADHD and med refills.  No concerns.  Declines flu and covid vaccines.     HPI: Darius Foster is a 30 y.o. male seen today for routine f/u on ADD and anxiety as well as medication refills. For ADD pt is currently taking Adderall 30mg  BID. For anxiety pt is taking klonopin 0.5mg  BID PRN. There are many days he does not take and at times he does take 2x/day. He notes work stress recently.  Meds are effective. No side effects. Sleep is ok - at times sleep is disrupted d/t work/stress. Appetite is "healthy" No CP, palpitations, SOB,  lightheadedness,.    Past Medical History:  Diagnosis Date  . ADD   . Anxiety 10/02/2012  . CARDIAC MURMUR    ECHO (-) 2008, saw cards "innocent murmur"    Past Surgical History:  Procedure Laterality Date  . NO PAST SURGERIES    . WISDOM TOOTH EXTRACTION      History reviewed. No pertinent family history.  Social History   Tobacco Use  . Smoking status: Former Smoker    Quit date: 2019    Years since quitting: 3.0  . Smokeless tobacco: Never Used  Substance Use Topics  . Alcohol use: Yes    Comment: socially   . Drug use: No     Current Outpatient  Medications:  .  amphetamine-dextroamphetamine (ADDERALL) 30 MG tablet, Take 1 tablet by mouth 2 (two) times daily., Disp: 60 tablet, Rfl: 0 .  clonazePAM (KLONOPIN) 0.5 MG tablet, Take 1 tablet (0.5 mg total) by mouth 2 (two) times daily as needed., Disp: 60 tablet, Rfl: 2 .  sildenafil (VIAGRA) 50 MG tablet, TAKE 1 TABLET(50 MG) BY MOUTH DAILY AS NEEDED FOR ERECTILE DYSFUNCTION, Disp: 10 tablet, Rfl: 5  No Known Allergies    ROS: See pertinent positives and negatives per HPI.   EXAM:  VITALS per patient if applicable: Ht 5\' 10"  (1.778 m)   Wt 160 lb (72.6 kg) Comment: pt reported  BMI 22.96 kg/m   BP Readings from Last 3 Encounters:  05/05/20 128/88  11/26/19 140/86  07/20/19 138/64   Pulse Readings from Last 3 Encounters:  05/05/20 95  11/26/19 89  07/20/19 88   Wt Readings from Last 3 Encounters:  10/04/20 160 lb (72.6 kg)  08/04/20 155 lb (70.3 kg)  05/05/20 165 lb 6.4 oz (75 kg)   GENERAL: alert, oriented, no acute distress  LUNGS: no obvious  SOB, gasping or wheezing, no conversational dyspnea  PSYCH/NEURO: pleasant and cooperative, speech and thought processing grossly intact   ASSESSMENT AND PLAN: 1. Attention deficit hyperactivity disorder (ADHD), predominantly inattentive type - controlled - database reviewed and appropriate - UDS UTD - controlled substance agreement on file Refill - amphetamine-dextroamphetamine (ADDERALL) 30  MG tablet; Take 1 tablet by mouth 2 (two) times daily.  Dispense: 60 tablet; Refill: 0 - f/u in 3 mo or sooner PRN - in-person OV  2. Anxiety - overall controlled - database reviewed and appropriate - UDS UTD - controlled substance agreement on file Refill: - clonazePAM (KLONOPIN) 0.5 MG tablet; Take 1 tablet (0.5 mg total) by mouth 2 (two) times daily as needed.  Dispense: 60 tablet; Refill: 2  3. Influenza vaccination declined by patient   I discussed the assessment and treatment plan with the patient. The patient was  provided an opportunity to ask questions and all were answered. The patient agreed with the plan and demonstrated an understanding of the instructions.   The patient was advised to call back or seek an in-person evaluation if the symptoms worsen or if the condition fails to improve as anticipated.  I spent 9 min on the phone with the patient.  Letta Median, DO

## 2020-10-11 MED ORDER — AMPHETAMINE-DEXTROAMPHETAMINE 30 MG PO TABS
30.0000 mg | ORAL_TABLET | Freq: Two times a day (BID) | ORAL | 0 refills | Status: DC
Start: 2020-10-11 — End: 2021-02-01

## 2020-10-11 MED ORDER — AMPHETAMINE-DEXTROAMPHETAMINE 30 MG PO TABS: 30.0000 mg | ORAL_TABLET | Freq: Two times a day (BID) | ORAL | 0 refills | Status: DC

## 2020-10-11 NOTE — Addendum Note (Signed)
Addended by: Ronnald Nian on: 10/11/2020 10:49 AM   Modules accepted: Orders

## 2020-12-20 DIAGNOSIS — H53141 Visual discomfort, right eye: Secondary | ICD-10-CM | POA: Diagnosis not present

## 2020-12-29 DIAGNOSIS — H1032 Unspecified acute conjunctivitis, left eye: Secondary | ICD-10-CM | POA: Diagnosis not present

## 2021-01-31 ENCOUNTER — Telehealth: Payer: Self-pay | Admitting: Family Medicine

## 2021-01-31 DIAGNOSIS — F419 Anxiety disorder, unspecified: Secondary | ICD-10-CM

## 2021-01-31 DIAGNOSIS — F900 Attention-deficit hyperactivity disorder, predominantly inattentive type: Secondary | ICD-10-CM

## 2021-01-31 NOTE — Telephone Encounter (Signed)
What is the name of the medication? Darius (ADDERALL) 30 MG tablet [[id number]] and Darius (KLONOPIN) 0.5 MG tablet [[id number]]    Have you contacted your pharmacy to request a refill? Yes pt is needing a refill on these scripts. We had to reschedule his app from 5/19 to 5/27 because his provider was not in office.   Which pharmacy would you like this sent to? Pharmacy  [facility], [address] [place] [address]  Phone:  [phone] Fax:  [phone]  DEA #:  [id number]      Patient notified that their request is being sent to the clinical staff for review and that they should receive a call once it is complete. If they do not receive a call within 72 hours they can check with their pharmacy or our office.

## 2021-02-01 ENCOUNTER — Ambulatory Visit: Payer: BC Managed Care – PPO | Admitting: Family Medicine

## 2021-02-01 MED ORDER — CLONAZEPAM 0.5 MG PO TABS: 0.5000 mg | ORAL_TABLET | Freq: Two times a day (BID) | ORAL | 0 refills | Status: DC | PRN

## 2021-02-01 MED ORDER — AMPHETAMINE-DEXTROAMPHETAMINE 30 MG PO TABS: 30.0000 mg | ORAL_TABLET | Freq: Two times a day (BID) | ORAL | 0 refills | Status: DC

## 2021-02-01 NOTE — Telephone Encounter (Signed)
rx refilled.

## 2021-02-01 NOTE — Telephone Encounter (Signed)
Last VV 10/04/20 Last fill for Adderall 10/11/20  #60/0 Last fill for Clonazepam 10/04/20  #60/2

## 2021-02-02 ENCOUNTER — Ambulatory Visit: Payer: BC Managed Care – PPO | Admitting: Family Medicine

## 2021-02-09 ENCOUNTER — Ambulatory Visit: Payer: BC Managed Care – PPO | Admitting: Family Medicine

## 2021-02-21 ENCOUNTER — Other Ambulatory Visit: Payer: Self-pay

## 2021-02-21 ENCOUNTER — Encounter: Payer: Self-pay | Admitting: Family Medicine

## 2021-02-21 ENCOUNTER — Ambulatory Visit (INDEPENDENT_AMBULATORY_CARE_PROVIDER_SITE_OTHER): Payer: BC Managed Care – PPO | Admitting: Family Medicine

## 2021-02-21 VITALS — BP 138/90 | HR 84 | Temp 98.7°F | Ht 70.0 in

## 2021-02-21 DIAGNOSIS — F419 Anxiety disorder, unspecified: Secondary | ICD-10-CM

## 2021-02-21 DIAGNOSIS — F900 Attention-deficit hyperactivity disorder, predominantly inattentive type: Secondary | ICD-10-CM | POA: Diagnosis not present

## 2021-02-21 DIAGNOSIS — Z79899 Other long term (current) drug therapy: Secondary | ICD-10-CM | POA: Diagnosis not present

## 2021-02-21 DIAGNOSIS — B360 Pityriasis versicolor: Secondary | ICD-10-CM | POA: Diagnosis not present

## 2021-02-21 MED ORDER — AMPHETAMINE-DEXTROAMPHETAMINE 30 MG PO TABS: 30.0000 mg | ORAL_TABLET | Freq: Two times a day (BID) | ORAL | 0 refills | Status: DC

## 2021-02-21 MED ORDER — KETOCONAZOLE 2 % EX CREA: 1.0000 "application " | TOPICAL_CREAM | Freq: Every day | CUTANEOUS | 2 refills | Status: DC

## 2021-02-21 MED ORDER — CLONAZEPAM 0.5 MG PO TABS: 0.5000 mg | ORAL_TABLET | Freq: Two times a day (BID) | ORAL | 2 refills | Status: DC | PRN

## 2021-02-21 MED ORDER — AMPHETAMINE-DEXTROAMPHETAMINE 30 MG PO TABS
30.0000 mg | ORAL_TABLET | Freq: Two times a day (BID) | ORAL | 0 refills | Status: DC
Start: 2021-02-21 — End: 2021-05-08

## 2021-02-21 NOTE — Progress Notes (Signed)
Darius Foster is a 30 y.o. male  Chief Complaint  Patient presents with  . Follow-up    3 Month follow up medication check    HPI: Darius Foster is a 30 y.o. male patient seen today for routine f/u on ADD and anxiety as well as medication refill. Pt is taking adderall $RemoveBeforeD'30mg'oagruugbwWOVpX$  BID for ADD and klonopin 0.$RemoveBefore'5mg'cZoPzojtdRUkj$  BID for anxiety. Symptoms well-controlled, stable. No side effects.  Sleep is good, appetite is good.  No HA, dizziness, vision changes, CP, SOB, palpitations.  He has a h/o tinea versicolor, typically in warmer months. Typically uses head & shoulders shampoo and then topical antifungal when needed. He requests refill of topical antifungal to use on his shoulders.   Past Medical History:  Diagnosis Date  . ADD   . Anxiety 10/02/2012  . CARDIAC MURMUR    ECHO (-) 2008, saw cards "innocent murmur"    Past Surgical History:  Procedure Laterality Date  . NO PAST SURGERIES    . WISDOM TOOTH EXTRACTION      Social History   Socioeconomic History  . Marital status: Single    Spouse name: Not on file  . Number of children: 0  . Years of education: Not on file  . Highest education level: Not on file  Occupational History  . Occupation: student -- welding  Tobacco Use  . Smoking status: Former Smoker    Quit date: 2019    Years since quitting: 3.4  . Smokeless tobacco: Never Used  Vaping Use  . Vaping Use: Former  Substance and Sexual Activity  . Alcohol use: Yes    Comment: socially   . Drug use: No  . Sexual activity: Not on file  Other Topics Concern  . Not on file  Social History Narrative   Graduated from [employer] (Art major) and going to welding school   Social Determinants of Health   Financial Resource Strain: Not on file  Food Insecurity: Not on file  Transportation Needs: Not on file  Physical Activity: Not on file  Stress: Not on file  Social Connections: Not on file  Intimate Partner Violence: Not on file    History reviewed. No  pertinent family history.   Immunization History  Administered Date(s) Administered  . DTP 02/03/1989, 03/27/1989, 05/22/1989, 06/08/1990, 01/22/1995  . Hepatitis A 03/26/2007  . Hepatitis B 04/08/1996, 06/04/1996, 10/20/1996  . HiB (PRP-OMP) 09/24/1989, 11/26/1989, 06/08/1990  . Influenza, Seasonal, Injecte, Preservative Fre 07/14/2014  . MMR 06/08/1990, 01/22/1995  . Meningococcal Polysaccharide 05/07/2005  . OPV 02/03/1989, 03/27/1989, 06/08/1990, 01/22/1995  . Td 09/16/1992, 05/07/2005  . Tdap 07/14/2014    Outpatient Encounter Medications as of 02/21/2021  Medication Sig  . ketoconazole (NIZORAL) 2 % cream Apply 1 application topically daily.  . sildenafil (VIAGRA) 50 MG tablet TAKE 1 TABLET(50 MG) BY MOUTH DAILY AS NEEDED FOR ERECTILE DYSFUNCTION  . [DISCONTINUED] amphetamine-dextroamphetamine (ADDERALL) 30 MG tablet Take 1 tablet by mouth 2 (two) times daily.  . [DISCONTINUED] clonazePAM (KLONOPIN) 0.5 MG tablet Take 1 tablet (0.5 mg total) by mouth 2 (two) times daily as needed.  . [DISCONTINUED] erythromycin ophthalmic ointment Place into the left eye nightly. Apply a thin 1 cm ribbon to left eye 4 times daily for 7-10 days  . amphetamine-dextroamphetamine (ADDERALL) 30 MG tablet Take 1 tablet by mouth 2 (two) times daily.  . clonazePAM (KLONOPIN) 0.5 MG tablet Take 1 tablet (0.5 mg total) by mouth 2 (two) times daily as needed.  . [DISCONTINUED] amphetamine-dextroamphetamine (  ADDERALL) 30 MG tablet Take 1 tablet by mouth 2 (two) times daily.  . [DISCONTINUED] amphetamine-dextroamphetamine (ADDERALL) 30 MG tablet Take 1 tablet by mouth 2 (two) times daily.  . [DISCONTINUED] prednisoLONE acetate (PRED FORTE) 1 % ophthalmic suspension Place into the right eye.   No facility-administered encounter medications on file as of 02/21/2021.     ROS: Pertinent positives and negatives noted in HPI. Remainder of ROS non-contributory   No Known Allergies  BP 138/90   Pulse 84   Temp  98.7 F (37.1 C)   Ht _0  (1.778 m)   SpO2 95%   BMI 22.96 kg/m   Wt Readings from Last 3 Encounters:  10/04/20 160 lb (72.6 kg)  08/04/20 155 lb (70.3 kg)  05/05/20 165 lb 6.4 oz (75 kg)   Temp Readings from Last 3 Encounters:  02/21/21 98.7 F (37.1 C)  05/05/20 98.4 F (36.9 C) (Tympanic)  11/26/19 97.8 F (36.6 C) (Temporal)   BP Readings from Last 3 Encounters:  02/21/21 138/90  05/05/20 128/88  11/26/19 140/86   Pulse Readings from Last 3 Encounters:  02/21/21 84  05/05/20 95  11/26/19 89     Physical Exam Constitutional:      General: He is not in acute distress.    Appearance: Normal appearance. He is not ill-appearing.  Cardiovascular:     Rate and Rhythm: Normal rate and regular rhythm.     Pulses: Normal pulses.     Heart sounds: Normal heart sounds.  Pulmonary:     Effort: Pulmonary effort is normal.     Breath sounds: Normal breath sounds. No wheezing or rhonchi.  Musculoskeletal:     Right lower leg: No edema.     Left lower leg: No edema.  Skin:    Comments: Scattered flat areas of hypopigmentation on B/L shoulders  Neurological:     Mental Status: He is alert and oriented to person, place, and time.  Psychiatric:        Mood and Affect: Mood normal.        Behavior: Behavior normal.      A/P:   1. Attention deficit hyperactivity disorder (ADHD), predominantly inattentive type - controlled, at goal - database reviewed and appropriate - controlled substance agreement on file - UDS today - DRUG MONITORING, PANEL 8 WITH CONFIRMATION, URINE Refill: - amphetamine-dextroamphetamine (ADDERALL) 30 MG tablet; Take 1 tablet by mouth 2 (two) times daily.  Dispense: 60 tablet; Refill: 0 - f/u in 3 mo or sooner PRN  2. Anxiety - controlled, at goal - database reviewed and appropriate - controlled substance agreement on file - UDS today - DRUG MONITORING, PANEL 8 WITH CONFIRMATION, URINE Refill: - clonazePAM (KLONOPIN) 0.5 MG tablet; Take  1 tablet (0.5 mg total) by mouth 2 (two) times daily as needed.  Dispense: 60 tablet; Refill: 2 - f/u in 3 mo or sooner PRN  3. High risk medications (not anticoagulants) long-term use - DRUG MONITORING, PANEL 8 WITH CONFIRMATION, URINE  4. Tinea versicolor Refill: - ketoconazole (NIZORAL) 2 % cream; Apply 1 application topically daily.  Dispense: 30 g; Refill: 2 - cont with head & shoulders or selsun blue wash - f/u PRN Discussed plan and reviewed medications with patient, including risks, benefits, and potential side effects. Pt expressed understand. All questions answered.    This visit occurred during the SARS-CoV-2 public health emergency.  Safety protocols were in place, including screening questions prior to the visit, additional usage of staff PPE, and extensive  cleaning of exam room while observing appropriate contact time as indicated for disinfecting solutions.

## 2021-02-24 ENCOUNTER — Encounter: Payer: Self-pay | Admitting: Family Medicine

## 2021-02-24 LAB — DRUG MONITORING, PANEL 8 WITH CONFIRMATION, URINE
6 Acetylmorphine: NEGATIVE ng/mL (ref <10)
Alcohol Metabolites: POSITIVE ng/mL — AB
Amphetamine: >15000 ng/mL — ABNORMAL HIGH (ref <250)
Amphetamines: POSITIVE ng/mL — AB (ref <500)
Benzodiazepines: NEGATIVE ng/mL (ref <100)
Buprenorphine, Urine: NEGATIVE ng/mL (ref <5)
Cocaine Metabolite: NEGATIVE ng/mL (ref <150)
Creatinine: 114.6 mg/dL
Ethyl Glucuronide (ETG): 2526 ng/mL — ABNORMAL HIGH (ref <500)
Ethyl Sulfate (ETS): 1567 ng/mL — ABNORMAL HIGH (ref <100)
MDMA: NEGATIVE ng/mL (ref <500)
Marijuana Metabolite: NEGATIVE ng/mL (ref <20)
Methamphetamine: NEGATIVE ng/mL (ref <250)
Opiates: NEGATIVE ng/mL (ref <100)
Oxidant: NEGATIVE ug/mL
Oxycodone: NEGATIVE ng/mL (ref <100)
pH: 5.9 (ref 4.5–9.0)

## 2021-02-24 LAB — DM TEMPLATE

## 2021-02-26 NOTE — Telephone Encounter (Signed)
Please see message and advise.  Thank you. ° °

## 2021-04-04 ENCOUNTER — Other Ambulatory Visit: Payer: Self-pay | Admitting: Family Medicine

## 2021-04-04 DIAGNOSIS — N529 Male erectile dysfunction, unspecified: Secondary | ICD-10-CM

## 2021-04-04 NOTE — Telephone Encounter (Signed)
Refill request for: Sildenafil 50 mg,  LR 07/19/2020, #10, 5 rfs LOV 02/21/21 FOV none scheduled.   Please review and advise.  Thanks. Dm/cma

## 2021-05-08 ENCOUNTER — Other Ambulatory Visit: Payer: Self-pay | Admitting: Family Medicine

## 2021-05-08 DIAGNOSIS — F900 Attention-deficit hyperactivity disorder, predominantly inattentive type: Secondary | ICD-10-CM

## 2021-05-08 NOTE — Telephone Encounter (Signed)
Pt called saying his normal pharmacy is out of adderall, and is [facility]  Dr Lurline Del pt, has TOC with Dr Gena Fray in Oct

## 2021-05-08 NOTE — Telephone Encounter (Signed)
Called [facility] '@[phone] spoke to [facility], confirmed that they are out of stock and are unable to get some in.  Then [phone], [facility] to [facility], was advised that they do have it in stock.    LR 02/21/21, #60, 2 rfs LOV 02/21/21 FOV  07/03/21 (Dr Gena Fray).  Can you please and thank you send new Rxto the pharmacy.   Thanks. Dm/cma

## 2021-05-09 MED ORDER — AMPHETAMINE-DEXTROAMPHETAMINE 30 MG PO TABS: 30.0000 mg | ORAL_TABLET | Freq: Two times a day (BID) | ORAL | 0 refills | Status: DC

## 2021-05-10 NOTE — Telephone Encounter (Signed)
Lft VM  that [facility]. Dm/cma

## 2021-06-04 ENCOUNTER — Other Ambulatory Visit: Payer: Self-pay | Admitting: Family

## 2021-06-04 ENCOUNTER — Telehealth: Payer: Self-pay | Admitting: Family Medicine

## 2021-06-04 DIAGNOSIS — F419 Anxiety disorder, unspecified: Secondary | ICD-10-CM

## 2021-06-04 DIAGNOSIS — F900 Attention-deficit hyperactivity disorder, predominantly inattentive type: Secondary | ICD-10-CM

## 2021-06-04 MED ORDER — CLONAZEPAM 0.5 MG PO TABS: 0.5000 mg | ORAL_TABLET | Freq: Two times a day (BID) | ORAL | 2 refills | Status: DC | PRN

## 2021-06-04 MED ORDER — AMPHETAMINE-DEXTROAMPHETAMINE 30 MG PO TABS: 30.0000 mg | ORAL_TABLET | Freq: Two times a day (BID) | ORAL | 0 refills | Status: DC

## 2021-06-04 NOTE — Telephone Encounter (Signed)
Refill request for the following medications:  Adderall 30 mg LR 05/09/21, #60, 0 rf  Clonazepam 0.5 mg LR 02/21/21, # 60, 2 rfs LOV 02/21/21  (Dr Bryan Lemma) [place]  07/03/21  (Dr Gena Fray)   He has refills at pharmacy for the Viagra.  Please review and advise.  Thanks.  Dm/cma

## 2021-06-05 ENCOUNTER — Other Ambulatory Visit: Payer: Self-pay | Admitting: Family Medicine

## 2021-06-05 DIAGNOSIS — F900 Attention-deficit hyperactivity disorder, predominantly inattentive type: Secondary | ICD-10-CM

## 2021-06-05 MED ORDER — AMPHETAMINE-DEXTROAMPHETAMINE 30 MG PO TABS: 30.0000 mg | ORAL_TABLET | Freq: Two times a day (BID) | ORAL | 0 refills | Status: DC

## 2021-06-05 NOTE — Telephone Encounter (Signed)
Spoke to patient and he states that the [facility] is out of stock for his Adderall and asking [facility]. He will call them to make sure they have it and call me back.  Dm/cma

## 2021-06-05 NOTE — Addendum Note (Signed)
Addended by: Konrad Saha on: 06/05/2021 01:14 PM   Modules accepted: Orders

## 2021-06-05 NOTE — Telephone Encounter (Signed)
[facility]. Can you please and thank send the Adderral 30 mg to them.  Will cancel the other script at [facility].   Thanks.  Dm/cma

## 2021-06-06 NOTE — Telephone Encounter (Signed)
Patient notified VIA phone. Dm/cma  

## 2021-07-03 ENCOUNTER — Ambulatory Visit (INDEPENDENT_AMBULATORY_CARE_PROVIDER_SITE_OTHER): Payer: BC Managed Care – PPO | Admitting: Family Medicine

## 2021-07-03 ENCOUNTER — Other Ambulatory Visit: Payer: Self-pay

## 2021-07-03 ENCOUNTER — Encounter: Payer: Self-pay | Admitting: Family Medicine

## 2021-07-03 VITALS — BP 138/84 | HR 78 | Temp 98.4°F | Ht 70.0 in | Wt 171.4 lb

## 2021-07-03 DIAGNOSIS — F419 Anxiety disorder, unspecified: Secondary | ICD-10-CM

## 2021-07-03 DIAGNOSIS — Z13220 Encounter for screening for lipoid disorders: Secondary | ICD-10-CM | POA: Diagnosis not present

## 2021-07-03 DIAGNOSIS — B360 Pityriasis versicolor: Secondary | ICD-10-CM | POA: Insufficient documentation

## 2021-07-03 DIAGNOSIS — F900 Attention-deficit hyperactivity disorder, predominantly inattentive type: Secondary | ICD-10-CM

## 2021-07-03 NOTE — Progress Notes (Signed)
[facility] [address] [place] [address] Dept: [phone] Dept Fax: [phone]  Transfer of Care Office Visit  Subjective:    Patient ID: Darius Foster, male    DOB: [birth year], 30 y.o..   MRN: [id number]  Chief Complaint  Patient presents with   Establish Care    [facility]- establish care.  No concerns.  Declines flu shot today.       History of Present Illness:  Patient is in today to establish care. Darius Foster was born in [place], [place]. His parents divorced soon after his birth and his mother moved to [place], where she had family. He attended [employer] and has lived in several locations before returning back to [place]. He is single and has no children. 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 He quit smoking in 2019. He drinks socially. He denies any drug use.  Darius Foster has a history of ADHD. He was diagnosed in middle school and has been treated with Adderall over the years. He notes that as an adult, he has improved focus at work, can better recall tasks, and improved prioritization.  Darius Foster also has a history of anxiety that was diagnosed in college. He notes that at the time it exhibited as panic attacks. He is currently managed on Klonopin. He was previously tried on other medications to treat his anxiety, but either found these ineffective or had intolerable side effects.  Darius Foster notes a history of tinea versicolor. He tends to flare with this in the summer. He uses ketoconazole 2% cream for flares. He uses Head & Shoulders for maintenance.  Darius Foster has a history of erectile dysfunction. He has used Viagra at times. He notes he is less sexually active currently, so has not needed this lately.  Past Medical History: Patient Active Problem List   Diagnosis Date Noted   Tinea versicolor 07/03/2021   Anxiety 10/02/2012   Attention deficit disorder  03/26/2007   Past Surgical History:  Procedure Laterality Date   WISDOM TOOTH EXTRACTION     Family History  Problem Relation Age of Onset   Hypertension Father    Outpatient Medications Prior to Visit  Medication Sig Dispense Refill   amphetamine-dextroamphetamine (ADDERALL) 30 MG tablet Take 1 tablet by mouth 2 (two) times daily. 60 tablet 0   clonazePAM (KLONOPIN) 0.5 MG tablet Take 1 tablet (0.5 mg total) by mouth 2 (two) times daily as needed. 60 tablet 2   ketoconazole (NIZORAL) 2 % cream Apply 1 application topically daily. 30 g 2   Multiple Vitamin (MULTIVITAMIN) tablet Take 1 tablet by mouth daily.     sildenafil (VIAGRA) 50 MG tablet TAKE 1 TABLET(50 MG) BY MOUTH DAILY AS NEEDED FOR ERECTILE DYSFUNCTION 30 tablet 5   No facility-administered medications prior to visit.   No Known Allergies    Objective:   Today's Vitals   07/03/21 1505  BP: 138/84  Pulse: 78  Temp: 98.4 F (36.9 C)  TempSrc: Temporal  SpO2: 99%  Weight: 171 lb 6.4 oz (77.7 kg)  Height: 5\' 10"  (1.778 m)   Body mass index is 24.59 kg/m.   General: Well developed, well nourished. No acute distress. Psych: Alert and oriented. Normal mood and affect.  Health Maintenance Due  Topic Date Due   COVID-19 Vaccine (1) Never done   HIV Screening  Never done   Hepatitis C Screening  Never done   INFLUENZA VACCINE  04/16/2021  Assessment & Plan:   1. Attention deficit hyperactivity disorder (ADHD), predominantly inattentive type Currently stable on Adderall XR 30 mg daily. We discussed continuing this medication. I will plan to see him back in 3 months.  2. Anxiety We discussed that typical treatment for anxiety co-existing with ADHD, would be an SSRI or SNRI. By history, he has already failed at these approaches. I will continue the Klonopin for now. Should his use start to escalate, I would consider a psychiatric referral for other management options.  3. Screening for lipid disorders Mr.  Foster will return for fasting lipids and a work-related physical within the next 3 months.  - Lipid panel; Future  Haydee Salter, MD

## 2021-07-11 ENCOUNTER — Encounter: Payer: Self-pay | Admitting: Family Medicine

## 2021-07-11 DIAGNOSIS — F900 Attention-deficit hyperactivity disorder, predominantly inattentive type: Secondary | ICD-10-CM

## 2021-07-11 MED ORDER — AMPHETAMINE-DEXTROAMPHETAMINE 30 MG PO TABS: 30.0000 mg | ORAL_TABLET | Freq: Two times a day (BID) | ORAL | 0 refills | Status: DC

## 2021-07-11 NOTE — Telephone Encounter (Signed)
Refill request for: Adderall 30 mg LR 06/05/21, # 60, 0 rf LOV  07/03/21 FOV  none scheduled.   Please review and advise.  Thanks. Dm/cma

## 2021-07-11 NOTE — Addendum Note (Signed)
Addended by: Haydee Salter on: 07/11/2021 04:40 PM   Modules accepted: Orders

## 2021-08-14 ENCOUNTER — Other Ambulatory Visit: Payer: Self-pay | Admitting: Family Medicine

## 2021-08-14 DIAGNOSIS — F900 Attention-deficit hyperactivity disorder, predominantly inattentive type: Secondary | ICD-10-CM

## 2021-08-14 NOTE — Telephone Encounter (Signed)
Refill request for: Adderall 30mg  LR 07/11/21, #60, 0 rf LOV  07/03/21 FOV  none scheduled.    Please review and advise.   Thanks.   Dm/cma

## 2021-08-15 MED ORDER — AMPHETAMINE-DEXTROAMPHETAMINE 30 MG PO TABS: 30.0000 mg | ORAL_TABLET | Freq: Two times a day (BID) | ORAL | 0 refills | Status: DC

## 2021-09-18 ENCOUNTER — Other Ambulatory Visit: Payer: Self-pay | Admitting: Family Medicine

## 2021-09-18 ENCOUNTER — Encounter: Payer: Self-pay | Admitting: Family Medicine

## 2021-09-18 DIAGNOSIS — F419 Anxiety disorder, unspecified: Secondary | ICD-10-CM

## 2021-09-18 DIAGNOSIS — F900 Attention-deficit hyperactivity disorder, predominantly inattentive type: Secondary | ICD-10-CM

## 2021-09-18 MED ORDER — AMPHETAMINE-DEXTROAMPHETAMINE 30 MG PO TABS: 30.0000 mg | ORAL_TABLET | Freq: Two times a day (BID) | ORAL | 0 refills | Status: DC

## 2021-09-18 MED ORDER — CLONAZEPAM 0.5 MG PO TABS: 0.5000 mg | ORAL_TABLET | Freq: Two times a day (BID) | ORAL | 2 refills | Status: DC | PRN

## 2021-09-18 NOTE — Telephone Encounter (Signed)
Refill request for:  Adderall 30 mg LR 08/15/21, #60, 0 rf LOV 07/03/21 FOV  10/17/21  Please review and advise.  Thanks. Dm/cma

## 2021-09-18 NOTE — Telephone Encounter (Signed)
Refill request for:  Clonazepam 0.5mg  LR 06/04/21, #60, 2 rf LOV  07/03/21 FOV 10/17/21  Please review and advise.  Thanks. Dm/cma

## 2021-09-21 ENCOUNTER — Other Ambulatory Visit: Payer: Self-pay

## 2021-09-21 ENCOUNTER — Other Ambulatory Visit (INDEPENDENT_AMBULATORY_CARE_PROVIDER_SITE_OTHER): Payer: BC Managed Care – PPO

## 2021-09-21 DIAGNOSIS — Z13220 Encounter for screening for lipoid disorders: Secondary | ICD-10-CM

## 2021-09-21 LAB — LIPID PANEL
Cholesterol: 192 mg/dL (ref 0–200)
HDL: 77.8 mg/dL (ref >39.00)
LDL Cholesterol: 95 mg/dL (ref 0–99)
NonHDL: 114.45
Total CHOL/HDL Ratio: 2
Triglycerides: 96 mg/dL (ref 0.0–149.0)
VLDL: 19.2 mg/dL (ref 0.0–40.0)

## 2021-10-16 ENCOUNTER — Other Ambulatory Visit: Payer: Self-pay

## 2021-10-17 ENCOUNTER — Ambulatory Visit (INDEPENDENT_AMBULATORY_CARE_PROVIDER_SITE_OTHER): Payer: BC Managed Care – PPO | Admitting: Family Medicine

## 2021-10-17 ENCOUNTER — Encounter: Payer: Self-pay | Admitting: Family Medicine

## 2021-10-17 VITALS — BP 138/82 | HR 80 | Temp 97.1°F | Ht 70.0 in | Wt 165.6 lb

## 2021-10-17 DIAGNOSIS — F900 Attention-deficit hyperactivity disorder, predominantly inattentive type: Secondary | ICD-10-CM

## 2021-10-17 DIAGNOSIS — F419 Anxiety disorder, unspecified: Secondary | ICD-10-CM

## 2021-10-17 MED ORDER — AMPHETAMINE-DEXTROAMPHETAMINE 30 MG PO TABS: 30.0000 mg | ORAL_TABLET | Freq: Two times a day (BID) | ORAL | 0 refills | Status: DC

## 2021-10-17 MED ORDER — CLONAZEPAM 0.5 MG PO TABS: 0.5000 mg | ORAL_TABLET | Freq: Two times a day (BID) | ORAL | 2 refills | Status: DC | PRN

## 2021-10-17 NOTE — Progress Notes (Signed)
[facility] [address] [place] [address] Dept: [phone] Dept Fax: [phone]  Chronic Care Office Visit  Subjective:    Patient ID: Darius Foster, male    DOB: 1990-11-02, 31 y.o..   MRN: [id number]  Chief Complaint  Patient presents with   Follow-up    F/u meds.  No concerns.       History of Present Illness:  Patient is in today for reassessment of chronic medical issues.  Mr. Reesman has a history of ADHD. He was diagnosed in middle school and has been treated with Adderall over the years. As an adult, he has improved focus at work, can better recall tasks, and improved prioritization while on his meds. he is sleeping well and denies any side effects.   Mr. Steinhauser also has a history of anxiety that was diagnosed in college. He notes that at the time it exhibited as panic attacks. He is currently managed on Klonopin. He was previously tried on other medications to treat his anxiety, but either found these ineffective or had intolerable side effects. He notes that his anxiety symptoms fluctuate, but are tolerable.  Past Medical History: Patient Active Problem List   Diagnosis Date Noted   Tinea versicolor 07/03/2021   Anxiety 10/02/2012   Attention deficit disorder 03/26/2007   Past Surgical History:  Procedure Laterality Date   WISDOM TOOTH EXTRACTION     Family History  Problem Relation Age of Onset   Alcohol abuse Father    Heart disease Father    Hypertension Father    Cancer Maternal Grandmother        Lung   Cancer Paternal Grandmother        Breast   Outpatient Medications Prior to Visit  Medication Sig Dispense Refill   ketoconazole (NIZORAL) 2 % cream Apply 1 application topically daily. 30 g 2   Multiple Vitamin (MULTIVITAMIN) tablet Take 1 tablet by mouth daily.     sildenafil (VIAGRA) 50 MG tablet TAKE 1 TABLET(50 MG) BY MOUTH DAILY AS NEEDED FOR ERECTILE DYSFUNCTION 30 tablet 5    amphetamine-dextroamphetamine (ADDERALL) 30 MG tablet Take 1 tablet by mouth 2 (two) times daily. 60 tablet 0   clonazePAM (KLONOPIN) 0.5 MG tablet Take 1 tablet (0.5 mg total) by mouth 2 (two) times daily as needed. 60 tablet 2   No facility-administered medications prior to visit.   No Known Allergies    Objective:   Today's Vitals   10/17/21 1534  BP: 138/82  Pulse: 80  Temp: (!) 97.1 F (36.2 C)  TempSrc: Temporal  SpO2: 98%  Weight: 165 lb 9.6 oz (75.1 kg)  Height: 5\' 10"  (1.778 m)   Body mass index is 23.76 kg/m.   General: Well developed, well nourished. No acute distress. Psych: Alert and oriented. Normal mood and affect.  Health Maintenance Due  Topic Date Due   COVID-19 Vaccine (1) Never done   HIV Screening  Never done   Hepatitis C Screening  Never done   INFLUENZA VACCINE  04/16/2021   Lab Results Last lipids Lab Results  Component Value Date   CHOL 192 09/21/2021   HDL 77.80 09/21/2021   LDLCALC 95 09/21/2021   TRIG 96.0 09/21/2021   CHOLHDL 2 09/21/2021   Assessment & Plan:   1. Attention deficit hyperactivity disorder (ADHD), predominantly inattentive type Mr. Rinkenberger is stable on Adderall. I will plan to continue this. Plan follow-up in 6 months.  - amphetamine-dextroamphetamine (ADDERALL) 30 MG  tablet; Take 1 tablet by mouth 2 (two) times daily.  Dispense: 60 tablet; Refill: 0  2. Anxiety Adequately managed with PRN Klonopin.  - clonazePAM (KLONOPIN) 0.5 MG tablet; Take 1 tablet (0.5 mg total) by mouth 2 (two) times daily as needed.  Dispense: 60 tablet; Refill: 2  Reviewed screening lipids. These are normal. Recommend he avoid nicotine, maintain a normal weight, and get regular exercise.   Haydee Salter, MD

## 2021-11-15 ENCOUNTER — Other Ambulatory Visit: Payer: Self-pay | Admitting: Family Medicine

## 2021-11-15 DIAGNOSIS — F900 Attention-deficit hyperactivity disorder, predominantly inattentive type: Secondary | ICD-10-CM

## 2021-11-15 NOTE — Telephone Encounter (Signed)
Refill request for: ?Adderall 30 mg ?LR 10/17/21, #60, 0 rf ?LOV 10/17/21 ?FOV 04/16/22 ? ?Please review and advise.  ?Thanks. Dm/cma ? ?

## 2021-11-19 MED ORDER — AMPHETAMINE-DEXTROAMPHETAMINE 30 MG PO TABS: 30.0000 mg | ORAL_TABLET | Freq: Two times a day (BID) | ORAL | 0 refills | Status: DC

## 2021-12-18 ENCOUNTER — Other Ambulatory Visit: Payer: Self-pay | Admitting: Family Medicine

## 2021-12-18 DIAGNOSIS — F900 Attention-deficit hyperactivity disorder, predominantly inattentive type: Secondary | ICD-10-CM

## 2021-12-19 MED ORDER — AMPHETAMINE-DEXTROAMPHETAMINE 30 MG PO TABS: 30.0000 mg | ORAL_TABLET | Freq: Two times a day (BID) | ORAL | 0 refills | Status: DC

## 2021-12-19 NOTE — Telephone Encounter (Signed)
Refill request for ?Adderall 30 mg  ?LR 11/19/21, #60, 0 rf ?LOV  10/17/21 ?FOV  04/16/22 ? ?Please review and advise.  ?Thanks. Dm/cma ? ? ?

## 2021-12-19 NOTE — Telephone Encounter (Signed)
Duplicate request unable to decline.  Please review and advise. Dm/cma ? ?

## 2022-01-22 ENCOUNTER — Other Ambulatory Visit: Payer: Self-pay | Admitting: Family Medicine

## 2022-01-22 DIAGNOSIS — F900 Attention-deficit hyperactivity disorder, predominantly inattentive type: Secondary | ICD-10-CM

## 2022-01-22 MED ORDER — AMPHETAMINE-DEXTROAMPHETAMINE 30 MG PO TABS: 30.0000 mg | ORAL_TABLET | Freq: Two times a day (BID) | ORAL | 0 refills | Status: DC

## 2022-02-26 ENCOUNTER — Other Ambulatory Visit: Payer: Self-pay | Admitting: Family Medicine

## 2022-02-26 DIAGNOSIS — F900 Attention-deficit hyperactivity disorder, predominantly inattentive type: Secondary | ICD-10-CM

## 2022-02-26 MED ORDER — AMPHETAMINE-DEXTROAMPHETAMINE 30 MG PO TABS: 30.0000 mg | ORAL_TABLET | Freq: Two times a day (BID) | ORAL | 0 refills | Status: DC

## 2022-03-20 ENCOUNTER — Encounter: Payer: Self-pay | Admitting: Family Medicine

## 2022-03-28 ENCOUNTER — Other Ambulatory Visit: Payer: Self-pay | Admitting: Family Medicine

## 2022-03-28 DIAGNOSIS — F900 Attention-deficit hyperactivity disorder, predominantly inattentive type: Secondary | ICD-10-CM

## 2022-03-28 MED ORDER — AMPHETAMINE-DEXTROAMPHETAMINE 30 MG PO TABS: 30.0000 mg | ORAL_TABLET | Freq: Two times a day (BID) | ORAL | 0 refills | Status: DC

## 2022-03-30 ENCOUNTER — Other Ambulatory Visit: Payer: Self-pay | Admitting: Family Medicine

## 2022-03-30 DIAGNOSIS — F419 Anxiety disorder, unspecified: Secondary | ICD-10-CM

## 2022-04-16 ENCOUNTER — Encounter: Payer: Self-pay | Admitting: Family Medicine

## 2022-04-16 ENCOUNTER — Ambulatory Visit (INDEPENDENT_AMBULATORY_CARE_PROVIDER_SITE_OTHER): Payer: BC Managed Care – PPO | Admitting: Family Medicine

## 2022-04-16 VITALS — BP 132/78 | HR 80 | Temp 97.6°F | Ht 70.0 in | Wt 163.0 lb

## 2022-04-16 DIAGNOSIS — F900 Attention-deficit hyperactivity disorder, predominantly inattentive type: Secondary | ICD-10-CM | POA: Diagnosis not present

## 2022-04-16 DIAGNOSIS — F419 Anxiety disorder, unspecified: Secondary | ICD-10-CM

## 2022-04-16 NOTE — Progress Notes (Signed)
[facility] [address] [place] [address] Dept: [phone] Dept Fax: [phone]  Chronic Care Office Visit  Subjective:    Patient ID: Darius Foster, male    DOB: [birth year], 31 y.o..   MRN: [id number]  Chief Complaint  Patient presents with   Follow-up    6 month f/u and annual wellness form to be filled out.        History of Present Illness:  Patient is in today for reassessment of chronic medical issues.  Darius Foster has a history of ADHD. He was diagnosed in middle school and has been treated with Adderall over the years. As an adult, he has improved focus at work, can better recall tasks, and improved prioritization while on his meds. He is sleeping well and denies any side effects. Although he has had weight loss over the past 10 months, he accounts for this being due to increased physical activity.   Darius Foster also has a history of anxiety that was diagnosed in college, exhibited as panic attacks. He is currently managed on Klonopin. He was previously tried on other medications to treat his anxiety, but either found these ineffective or had intolerable side effects. He notes that his anxiety symptoms fluctuate, but are tolerable. He is holding his Klonopin to no more than 2 doses a day and has days where he takes none.  Past Medical History: Patient Active Problem List   Diagnosis Date Noted   Tinea versicolor 07/03/2021   Anxiety 10/02/2012   Attention deficit disorder 03/26/2007   Past Surgical History:  Procedure Laterality Date   WISDOM TOOTH EXTRACTION     Family History  Problem Relation Age of Onset   Alcohol abuse Father    Heart disease Father    Hypertension Father    Cancer Maternal Grandmother        Lung   Cancer Paternal Grandmother        Breast   Outpatient Medications Prior to Visit  Medication Sig Dispense Refill   amphetamine-dextroamphetamine (ADDERALL) 30 MG tablet  Take 1 tablet by mouth 2 (two) times daily. 60 tablet 0   clonazePAM (KLONOPIN) 0.5 MG tablet TAKE 1 TABLET BY MOUTH 2 TIMES DAILY AS NEEDED. 60 tablet 0   ketoconazole (NIZORAL) 2 % cream Apply 1 application topically daily. 30 g 2   Multiple Vitamin (MULTIVITAMIN) tablet Take 1 tablet by mouth daily.     sildenafil (VIAGRA) 50 MG tablet TAKE 1 TABLET(50 MG) BY MOUTH DAILY AS NEEDED FOR ERECTILE DYSFUNCTION 30 tablet 5   No facility-administered medications prior to visit.   No Known Allergies    Objective:   Today's Vitals   04/16/22 1538  BP: 132/78  Pulse: 80  Temp: 97.6 F (36.4 C)  TempSrc: Temporal  SpO2: 96%  Weight: 163 lb (73.9 kg)  Height: '5\' 10"'$  (1.778 m)   Body mass index is 23.39 kg/m.   General: Well developed, well nourished. No acute distress. HEENT: Normocephalic, non-traumatic. External ears normal. EAC and TMs normal bilaterally. PERRL, EOMI.   Conjunctiva clear. Nose clear without congestion or rhinorrhea. Mucous membranes moist. Oropharynx clear.  Good dentition. Neck: Supple. No lymphadenopathy. No thyromegaly. Lungs: Clear to auscultation bilaterally. No wheezing, rales or rhonchi. CV: RRR without murmurs or rubs. Pulses 2+ bilaterally. Abdomen: Soft, non-tender. Bowel sounds positive, normal pitch and frequency. No hepatosplenomegaly. No   rebound or guarding. Back: Straight. No CVA tenderness bilaterally. Extremities: Full ROM. No joint swelling  or tenderness. No edema noted. Skin: Warm and dry. No rashes. Psych: Alert and oriented. Normal mood and affect.  Health Maintenance Due  Topic Date Due   HIV Screening  Never done   Hepatitis C Screening  Never done   INFLUENZA VACCINE  04/16/2022     Assessment & Plan:   1. Attention deficit hyperactivity disorder (ADHD), predominantly inattentive type Stable. Continue Adderall 30 mg bid. I will see him back in 6 months.  2. Anxiety Stable with Klonopin 0.5 mg 1-2 times a day PRN.   Return in  about 6 months (around 10/17/2022) for Reassessment.   Haydee Salter, MD

## 2022-04-18 ENCOUNTER — Other Ambulatory Visit: Payer: Self-pay | Admitting: Family Medicine

## 2022-04-18 DIAGNOSIS — F900 Attention-deficit hyperactivity disorder, predominantly inattentive type: Secondary | ICD-10-CM

## 2022-04-18 DIAGNOSIS — F419 Anxiety disorder, unspecified: Secondary | ICD-10-CM

## 2022-04-19 MED ORDER — AMPHETAMINE-DEXTROAMPHETAMINE 30 MG PO TABS: 30.0000 mg | ORAL_TABLET | Freq: Two times a day (BID) | ORAL | 0 refills | Status: DC

## 2022-04-19 NOTE — Telephone Encounter (Signed)
Refill request for  Adderall 30 mg LR 03/28/22, #60, 0 rf  Clonazapam 0.5 mg LR 04/06/22, #60, 0 rf LOV 04/16/22 FOV   none scheduled  Please review and advise.   Thanks Dm/cma

## 2022-06-03 ENCOUNTER — Other Ambulatory Visit: Payer: Self-pay | Admitting: Family Medicine

## 2022-06-03 DIAGNOSIS — F900 Attention-deficit hyperactivity disorder, predominantly inattentive type: Secondary | ICD-10-CM

## 2022-06-03 DIAGNOSIS — F419 Anxiety disorder, unspecified: Secondary | ICD-10-CM

## 2022-06-03 MED ORDER — CLONAZEPAM 0.5 MG PO TABS: ORAL_TABLET | ORAL | 0 refills | Status: DC

## 2022-06-03 MED ORDER — AMPHETAMINE-DEXTROAMPHETAMINE 30 MG PO TABS: 30.0000 mg | ORAL_TABLET | Freq: Two times a day (BID) | ORAL | 0 refills | Status: DC

## 2022-06-03 NOTE — Telephone Encounter (Signed)
Refill request for  Clonazepam 0.5 mg LR 04/06/22, #60, 0 rf  Adderall 30 mg LR 04/19/22, #60, 0 rf LOV 04/16/22 FOV none scheduled.    Please review and advise.   Thanks  Dm/cma

## 2022-07-02 ENCOUNTER — Other Ambulatory Visit: Payer: Self-pay | Admitting: Family Medicine

## 2022-07-02 DIAGNOSIS — F900 Attention-deficit hyperactivity disorder, predominantly inattentive type: Secondary | ICD-10-CM

## 2022-07-02 MED ORDER — AMPHETAMINE-DEXTROAMPHETAMINE 30 MG PO TABS: 30.0000 mg | ORAL_TABLET | Freq: Two times a day (BID) | ORAL | 0 refills | Status: DC

## 2022-07-02 NOTE — Telephone Encounter (Signed)
Refill request for  Adderall 30 mg  LR 9/18/223, #60, 0 rf LOV 04/16/22 FOV none scheduled.  Please review and advise. Dm/cma

## 2022-08-05 ENCOUNTER — Other Ambulatory Visit: Payer: Self-pay | Admitting: Nurse Practitioner

## 2022-08-05 DIAGNOSIS — F900 Attention-deficit hyperactivity disorder, predominantly inattentive type: Secondary | ICD-10-CM

## 2022-08-06 MED ORDER — AMPHETAMINE-DEXTROAMPHETAMINE 30 MG PO TABS: 30.0000 mg | ORAL_TABLET | Freq: Two times a day (BID) | ORAL | 0 refills | Status: DC

## 2022-08-07 ENCOUNTER — Other Ambulatory Visit: Payer: Self-pay | Admitting: Family Medicine

## 2022-08-07 DIAGNOSIS — F419 Anxiety disorder, unspecified: Secondary | ICD-10-CM

## 2022-08-07 MED ORDER — CLONAZEPAM 0.5 MG PO TABS: ORAL_TABLET | ORAL | 0 refills | Status: DC

## 2022-08-07 NOTE — Telephone Encounter (Signed)
Refill request for  Clonazepam 0.5 mg LR 06/03/22, #60, 0 rf LOV 04/16/22 FOV  none scheduled.  Please review and advise.  Thanks. Dm/cma

## 2022-09-05 ENCOUNTER — Other Ambulatory Visit: Payer: Self-pay | Admitting: Family Medicine

## 2022-09-05 DIAGNOSIS — F900 Attention-deficit hyperactivity disorder, predominantly inattentive type: Secondary | ICD-10-CM

## 2022-09-06 MED ORDER — AMPHETAMINE-DEXTROAMPHETAMINE 30 MG PO TABS: 30.0000 mg | ORAL_TABLET | Freq: Two times a day (BID) | ORAL | 0 refills | Status: DC

## 2022-09-06 NOTE — Telephone Encounter (Signed)
Refill request for  Adderall 30 mg  LR 08/06/22, #60, 0 rf LOV 10/17/21 FOV  none scheduled.   Please review and advise.  Thanks. Dm/cma

## 2022-09-17 ENCOUNTER — Encounter: Payer: Self-pay | Admitting: Family Medicine

## 2022-09-17 ENCOUNTER — Other Ambulatory Visit: Payer: Self-pay | Admitting: Family Medicine

## 2022-09-17 DIAGNOSIS — F419 Anxiety disorder, unspecified: Secondary | ICD-10-CM

## 2022-09-18 MED ORDER — CLONAZEPAM 0.5 MG PO TABS: ORAL_TABLET | ORAL | 2 refills | Status: DC

## 2022-09-18 NOTE — Telephone Encounter (Signed)
Refill request for  Clonazepam 0.5 mg LR 08/07/22, #60, 0 rf LOV 04/16/22 FOV  none scheduled.   Please review and advise. Thanks. Dm/cma

## 2022-09-23 ENCOUNTER — Ambulatory Visit (INDEPENDENT_AMBULATORY_CARE_PROVIDER_SITE_OTHER): Payer: BC Managed Care – PPO | Admitting: Family Medicine

## 2022-09-23 ENCOUNTER — Encounter: Payer: Self-pay | Admitting: Family Medicine

## 2022-09-23 VITALS — BP 152/74 | HR 82 | Temp 97.5°F | Ht 70.0 in | Wt 177.0 lb

## 2022-09-23 DIAGNOSIS — F419 Anxiety disorder, unspecified: Secondary | ICD-10-CM | POA: Diagnosis not present

## 2022-09-23 DIAGNOSIS — F900 Attention-deficit hyperactivity disorder, predominantly inattentive type: Secondary | ICD-10-CM

## 2022-09-23 MED ORDER — AMPHETAMINE-DEXTROAMPHETAMINE 30 MG PO TABS: 30.0000 mg | ORAL_TABLET | Freq: Two times a day (BID) | ORAL | 0 refills | Status: DC

## 2022-09-23 NOTE — Progress Notes (Signed)
  [facility] [address] [place] [address] Dept: [phone] Dept Fax: [phone]  Chronic Care Office Visit  Subjective:    Patient ID: Darius Foster, male    DOB: 1991-03-03, 32 y.o..   MRN: [id number]  Chief Complaint  Patient presents with   Follow-up    F/u ADHD.  No concerns.     History of Present Illness:  Patient is in today for reassessment of chronic medical issues.  Mr. Darius Foster has a history of ADHD. He is managed on Adderall 30 mg daily. He has improved focus at work, can better recall tasks, and improved prioritization while on his meds. He is sleeping well and denies any side effects.   Mr. Darius Foster also has a history of anxiety that was diagnosed in college, exhibited as panic attacks. He is currently managed on Klonopin. He was previously tried on other medications to treat his anxiety, but either found these ineffective or had intolerable side effects. He notes that his anxiety symptoms fluctuate, but are tolerable.   Past Medical History: Patient Active Problem List   Diagnosis Date Noted   Tinea versicolor 07/03/2021   Anxiety 10/02/2012   Attention deficit disorder 03/26/2007   Past Surgical History:  Procedure Laterality Date   WISDOM TOOTH EXTRACTION     Family History  Problem Relation Age of Onset   Alcohol abuse Father    Heart disease Father    Hypertension Father    Cancer Maternal Grandmother        Lung   Cancer Paternal Grandmother        Breast   Outpatient Medications Prior to Visit  Medication Sig Dispense Refill   clonazePAM (KLONOPIN) 0.5 MG tablet Take 1 tablet (0.5 mg total) by mouth 1-2 times a day as needed. 60 tablet 2   ketoconazole (NIZORAL) 2 % cream Apply 1 application topically daily. 30 g 2   Multiple Vitamin (MULTIVITAMIN) tablet Take 1 tablet by mouth daily.     sildenafil (VIAGRA) 50 MG tablet TAKE 1 TABLET(50 MG) BY MOUTH DAILY AS NEEDED FOR ERECTILE  DYSFUNCTION 30 tablet 5   amphetamine-dextroamphetamine (ADDERALL) 30 MG tablet Take 1 tablet by mouth 2 (two) times daily. 60 tablet 0   No facility-administered medications prior to visit.   No Known Allergies    Objective:   Today's Vitals   09/23/22 1457  BP: (!) 152/74  Pulse: 82  Temp: (!) 97.5 F (36.4 C)  TempSrc: Temporal  SpO2: 98%  Weight: 177 lb (80.3 kg)  Height: '5\' 10"'$  (1.778 m)   Body mass index is 25.4 kg/m.   General: Well developed, well nourished. No acute distress. Psych: Alert and oriented. Normal mood and affect.  Health Maintenance Due  Topic Date Due   HIV Screening  Never done   Hepatitis C Screening  Never done     Assessment & Plan:   1. Attention deficit hyperactivity disorder (ADHD), predominantly inattentive type Stable. Continue Adderall.  - amphetamine-dextroamphetamine (ADDERALL) 30 MG tablet; Take 1 tablet by mouth 2 (two) times daily.  Dispense: 60 tablet; Refill: 0  2. Anxiety Stable with Klonopin 0.5 mg 1-2 times a day PRN.   Return in about 6 months (around 03/24/2023) for Reassessment.   Haydee Salter, MD

## 2022-11-08 ENCOUNTER — Other Ambulatory Visit: Payer: Self-pay | Admitting: Family Medicine

## 2022-11-08 DIAGNOSIS — F900 Attention-deficit hyperactivity disorder, predominantly inattentive type: Secondary | ICD-10-CM

## 2022-11-08 MED ORDER — AMPHETAMINE-DEXTROAMPHETAMINE 30 MG PO TABS: 30.0000 mg | ORAL_TABLET | Freq: Two times a day (BID) | ORAL | 0 refills | Status: DC

## 2022-11-08 NOTE — Telephone Encounter (Signed)
Refill request for  Adderall 30 mg LR 09/23/22, #60, 0 rf LOV 09/23/22 FOV  none scheduled.  Please review and advise.   Thanks.   Dm/cma

## 2022-12-09 ENCOUNTER — Other Ambulatory Visit: Payer: Self-pay | Admitting: Family Medicine

## 2022-12-09 DIAGNOSIS — F900 Attention-deficit hyperactivity disorder, predominantly inattentive type: Secondary | ICD-10-CM

## 2022-12-10 ENCOUNTER — Other Ambulatory Visit: Payer: Self-pay | Admitting: Family Medicine

## 2022-12-10 DIAGNOSIS — F900 Attention-deficit hyperactivity disorder, predominantly inattentive type: Secondary | ICD-10-CM

## 2022-12-10 MED ORDER — AMPHETAMINE-DEXTROAMPHETAMINE 30 MG PO TABS: 30.0000 mg | ORAL_TABLET | Freq: Two times a day (BID) | ORAL | 0 refills | Status: DC

## 2022-12-10 NOTE — Telephone Encounter (Signed)
Refill request for  Adderall 30 mg LR 11/08/22, #60, 0 rf LOV 09/23/22 FOV  none scheduled.    Please review and advise.  Thanks. Dm/cma

## 2023-01-08 ENCOUNTER — Other Ambulatory Visit: Payer: Self-pay | Admitting: Family Medicine

## 2023-01-08 ENCOUNTER — Encounter: Payer: Self-pay | Admitting: Family Medicine

## 2023-01-08 DIAGNOSIS — F900 Attention-deficit hyperactivity disorder, predominantly inattentive type: Secondary | ICD-10-CM

## 2023-01-08 MED ORDER — AMPHETAMINE-DEXTROAMPHETAMINE 30 MG PO TABS: 30.0000 mg | ORAL_TABLET | Freq: Two times a day (BID) | ORAL | 0 refills | Status: DC

## 2023-01-10 ENCOUNTER — Encounter: Payer: Self-pay | Admitting: Family Medicine

## 2023-01-10 ENCOUNTER — Other Ambulatory Visit: Payer: Self-pay | Admitting: Family Medicine

## 2023-01-10 DIAGNOSIS — F419 Anxiety disorder, unspecified: Secondary | ICD-10-CM

## 2023-02-06 ENCOUNTER — Other Ambulatory Visit: Payer: Self-pay | Admitting: Family Medicine

## 2023-02-06 DIAGNOSIS — F900 Attention-deficit hyperactivity disorder, predominantly inattentive type: Secondary | ICD-10-CM

## 2023-02-07 MED ORDER — AMPHETAMINE-DEXTROAMPHETAMINE 30 MG PO TABS: 30.0000 mg | ORAL_TABLET | Freq: Two times a day (BID) | ORAL | 0 refills | Status: DC

## 2023-02-07 NOTE — Telephone Encounter (Signed)
Refill request for  Adderall 30 mg LR 01/08/23, #60, 0 rf LOV 09/23/22 FOV  none scheduled. Dm/cma

## 2023-03-10 ENCOUNTER — Other Ambulatory Visit: Payer: Self-pay | Admitting: Family Medicine

## 2023-03-10 DIAGNOSIS — F900 Attention-deficit hyperactivity disorder, predominantly inattentive type: Secondary | ICD-10-CM

## 2023-03-10 MED ORDER — AMPHETAMINE-DEXTROAMPHETAMINE 30 MG PO TABS: 30.0000 mg | ORAL_TABLET | Freq: Two times a day (BID) | ORAL | 0 refills | Status: DC

## 2023-03-10 NOTE — Telephone Encounter (Signed)
Refill request for  Adderall 30 mg LR 02/08/23 LOV 09/23/22 FOV  03/19/23  Please review and advise.  Dm/cma

## 2023-03-19 ENCOUNTER — Ambulatory Visit: Payer: BC Managed Care – PPO | Admitting: Family Medicine

## 2023-03-19 ENCOUNTER — Encounter: Payer: Self-pay | Admitting: Family Medicine

## 2023-03-19 VITALS — BP 150/74 | HR 106 | Temp 97.0°F | Ht 70.0 in | Wt 171.0 lb

## 2023-03-19 DIAGNOSIS — F900 Attention-deficit hyperactivity disorder, predominantly inattentive type: Secondary | ICD-10-CM | POA: Diagnosis not present

## 2023-03-19 DIAGNOSIS — N529 Male erectile dysfunction, unspecified: Secondary | ICD-10-CM | POA: Insufficient documentation

## 2023-03-19 DIAGNOSIS — F419 Anxiety disorder, unspecified: Secondary | ICD-10-CM | POA: Diagnosis not present

## 2023-03-19 DIAGNOSIS — I10 Essential (primary) hypertension: Secondary | ICD-10-CM | POA: Insufficient documentation

## 2023-03-19 DIAGNOSIS — R030 Elevated blood-pressure reading, without diagnosis of hypertension: Secondary | ICD-10-CM | POA: Diagnosis not present

## 2023-03-19 MED ORDER — CLONAZEPAM 0.5 MG PO TABS: ORAL_TABLET | ORAL | 2 refills | Status: DC

## 2023-03-19 MED ORDER — SILDENAFIL CITRATE 50 MG PO TABS: ORAL_TABLET | ORAL | 5 refills | Status: DC

## 2023-03-19 NOTE — Assessment & Plan Note (Signed)
Discussed elevated systolic blood pressures seen at last two visits. I recommend he obtain a home BP monitor and track this. I asked he send me his log in 1 month. Reviewed non-pharmacologic approaches to reducing BP.

## 2023-03-19 NOTE — Assessment & Plan Note (Signed)
Stable. Continue to manage with clonazepam 0.5 mg 1-2 times a day as needed. Reviewed deep breathing for panic.

## 2023-03-19 NOTE — Progress Notes (Signed)
[facility] [address] [place] [address] Dept: [phone] Dept Fax: [phone]  Chronic Care Office Visit  Subjective:    Patient ID: Darius Foster, male    DOB: Jan 22, 1991, 32 y.o..   MRN: [id number]  Chief Complaint  Patient presents with   Medical Management of Chronic Issues    6 month f/u ADHD/anxiety.  No concerns.   j   History of Present Illness:  Patient is in today for reassessment of chronic medical issues.  Darius Foster has a history of ADHD. He is managed on Adderall 30 mg daily. He has improved focus at work, can better recall tasks, and improved prioritization while on his meds. He is sleeping well and denies any side effects. He has been intentionally trying to lose some weight he had gained last winter.   Darius Foster also has a history of anxiety that was diagnosed in college, exhibited as panic attacks. He is currently managed on Klonopin. He was previously tried on other medications to treat his anxiety, but either found these ineffective or had intolerable side effects. He notes that his anxiety symptoms fluctuate, but are tolerable. He finds pressure on his job to be a trigger for him.  Past Medical History: Patient Active Problem List   Diagnosis Date Noted   Tinea versicolor 07/03/2021   Anxiety 10/02/2012   Attention deficit disorder 03/26/2007   Past Surgical History:  Procedure Laterality Date   WISDOM TOOTH EXTRACTION     Family History  Problem Relation Age of Onset   Alcohol abuse Father    Heart disease Father    Hypertension Father    Cancer Maternal Grandmother        Lung   Cancer Paternal Grandmother        Breast   Outpatient Medications Prior to Visit  Medication Sig Dispense Refill   amphetamine-dextroamphetamine (ADDERALL) 30 MG tablet Take 1 tablet by mouth 2 (two) times daily. 60 tablet 0   ketoconazole (NIZORAL) 2 % cream Apply 1 application topically daily.  30 g 2   Multiple Vitamin (MULTIVITAMIN) tablet Take 1 tablet by mouth daily.     clonazePAM (KLONOPIN) 0.5 MG tablet TAKE 1 TABLET (0.5 MG TOTAL) BY MOUTH 1-2 TIMES A DAY AS NEEDED. 60 tablet 2   sildenafil (VIAGRA) 50 MG tablet TAKE 1 TABLET(50 MG) BY MOUTH DAILY AS NEEDED FOR ERECTILE DYSFUNCTION 30 tablet 5   No facility-administered medications prior to visit.   No Known Allergies Objective:   Today's Vitals   03/19/23 0825 03/19/23 0847  BP: (!) 150/76 (!) 150/74  Pulse: (!) 106   Temp: (!) 97 F (36.1 C)   TempSrc: Temporal   SpO2: 96%   Weight: 171 lb (77.6 kg)   Height: 5\' 10"  (1.778 m)    Body mass index is 24.54 kg/m.   General: Well developed, well nourished. No acute distress. Psych: Alert and oriented. Normal mood and affect.  Health Maintenance Due  Topic Date Due   HIV Screening  Never done   Hepatitis C Screening  Never done     Assessment & Plan:   Problem List Items Addressed This Visit       Other   Attention deficit disorder - Primary    Stable. Plan to continue Adderall 30 mg daily.      Anxiety    Stable. Continue to manage with clonazepam 0.5 mg 1-2 times a day as needed. Reviewed deep breathing for panic.  Relevant Medications   clonazePAM (KLONOPIN) 0.5 MG tablet   Elevated blood-pressure reading, without diagnosis of hypertension    Discussed elevated systolic blood pressures seen at last two visits. I recommend he obtain a home BP monitor and track this. I asked he send me his log in 1 month. Reviewed non-pharmacologic approaches to reducing BP.      Erectile dysfunction    Continue sildenafil.      Relevant Medications   sildenafil (VIAGRA) 50 MG tablet    Return in about 6 months (around 09/19/2023) for Reassessment.   Loyola Mast, MD

## 2023-03-19 NOTE — Assessment & Plan Note (Signed)
-   Continue sildenafil °

## 2023-03-19 NOTE — Assessment & Plan Note (Signed)
Stable. Plan to continue Adderall 30 mg daily.

## 2023-04-07 ENCOUNTER — Other Ambulatory Visit: Payer: Self-pay | Admitting: Family Medicine

## 2023-04-07 DIAGNOSIS — F900 Attention-deficit hyperactivity disorder, predominantly inattentive type: Secondary | ICD-10-CM

## 2023-04-07 MED ORDER — AMPHETAMINE-DEXTROAMPHETAMINE 30 MG PO TABS: 30.0000 mg | ORAL_TABLET | Freq: Two times a day (BID) | ORAL | 0 refills | Status: DC

## 2023-04-08 NOTE — Telephone Encounter (Signed)
Patient notified VIA phone. Dm/cma  

## 2023-04-25 ENCOUNTER — Encounter: Payer: Self-pay | Admitting: Family Medicine

## 2023-05-09 ENCOUNTER — Other Ambulatory Visit: Payer: Self-pay | Admitting: Family Medicine

## 2023-05-09 DIAGNOSIS — F900 Attention-deficit hyperactivity disorder, predominantly inattentive type: Secondary | ICD-10-CM

## 2023-05-09 MED ORDER — AMPHETAMINE-DEXTROAMPHETAMINE 30 MG PO TABS: 30.0000 mg | ORAL_TABLET | Freq: Two times a day (BID) | ORAL | 0 refills | Status: DC

## 2023-06-11 ENCOUNTER — Other Ambulatory Visit: Payer: Self-pay | Admitting: Family Medicine

## 2023-06-11 DIAGNOSIS — F900 Attention-deficit hyperactivity disorder, predominantly inattentive type: Secondary | ICD-10-CM

## 2023-06-11 MED ORDER — AMPHETAMINE-DEXTROAMPHETAMINE 30 MG PO TABS
30.0000 mg | ORAL_TABLET | Freq: Two times a day (BID) | ORAL | 0 refills | Status: DC
Start: 2023-06-11 — End: 2023-07-11

## 2023-07-08 ENCOUNTER — Encounter: Payer: Self-pay | Admitting: Family Medicine

## 2023-07-09 ENCOUNTER — Ambulatory Visit: Payer: BC Managed Care – PPO | Admitting: Family Medicine

## 2023-07-11 ENCOUNTER — Other Ambulatory Visit: Payer: Self-pay | Admitting: Family Medicine

## 2023-07-11 DIAGNOSIS — F900 Attention-deficit hyperactivity disorder, predominantly inattentive type: Secondary | ICD-10-CM

## 2023-07-11 MED ORDER — AMPHETAMINE-DEXTROAMPHETAMINE 30 MG PO TABS
30.0000 mg | ORAL_TABLET | Freq: Two times a day (BID) | ORAL | 0 refills | Status: DC
Start: 2023-07-11 — End: 2023-08-11

## 2023-08-11 ENCOUNTER — Other Ambulatory Visit: Payer: Self-pay | Admitting: Family Medicine

## 2023-08-11 DIAGNOSIS — F419 Anxiety disorder, unspecified: Secondary | ICD-10-CM

## 2023-08-11 DIAGNOSIS — F900 Attention-deficit hyperactivity disorder, predominantly inattentive type: Secondary | ICD-10-CM

## 2023-08-11 MED ORDER — AMPHETAMINE-DEXTROAMPHETAMINE 30 MG PO TABS
30.0000 mg | ORAL_TABLET | Freq: Two times a day (BID) | ORAL | 0 refills | Status: DC
Start: 2023-08-11 — End: 2023-09-07

## 2023-08-11 NOTE — Telephone Encounter (Signed)
Refill request for  Clonazepam 0.5 mg LR 03/19/23, #60, 2 rf LOVFOV  08/20/23  Please review and advise.  Thanks. Dm/cma

## 2023-08-20 ENCOUNTER — Ambulatory Visit (INDEPENDENT_AMBULATORY_CARE_PROVIDER_SITE_OTHER): Payer: BC Managed Care – PPO | Admitting: Family Medicine

## 2023-08-20 ENCOUNTER — Encounter: Payer: Self-pay | Admitting: Family Medicine

## 2023-08-20 VITALS — BP 130/76 | HR 70 | Temp 98.6°F | Ht 70.0 in | Wt 185.6 lb

## 2023-08-20 DIAGNOSIS — F900 Attention-deficit hyperactivity disorder, predominantly inattentive type: Secondary | ICD-10-CM

## 2023-08-20 DIAGNOSIS — I10 Essential (primary) hypertension: Secondary | ICD-10-CM

## 2023-08-20 DIAGNOSIS — Z0000 Encounter for general adult medical examination without abnormal findings: Secondary | ICD-10-CM | POA: Diagnosis not present

## 2023-08-20 DIAGNOSIS — R683 Clubbing of fingers: Secondary | ICD-10-CM | POA: Diagnosis not present

## 2023-08-20 DIAGNOSIS — F419 Anxiety disorder, unspecified: Secondary | ICD-10-CM

## 2023-08-20 LAB — BASIC METABOLIC PANEL
BUN: 10 mg/dL (ref 6–23)
CO2: 28 meq/L (ref 19–32)
Calcium: 9.6 mg/dL (ref 8.4–10.5)
Chloride: 97 meq/L (ref 96–112)
Creatinine, Ser: 0.75 mg/dL (ref 0.40–1.50)
GFR: 117.56 mL/min (ref >60.00)
Glucose, Bld: 83 mg/dL (ref 70–99)
Potassium: 3.8 meq/L (ref 3.5–5.1)
Sodium: 137 meq/L (ref 135–145)

## 2023-08-20 MED ORDER — HYDROCHLOROTHIAZIDE 25 MG PO TABS: 25.0000 mg | ORAL_TABLET | Freq: Every day | ORAL | 3 refills | Status: DC

## 2023-08-20 NOTE — Assessment & Plan Note (Signed)
Stable. Plan to continue Adderall 30 mg daily.

## 2023-08-20 NOTE — Assessment & Plan Note (Signed)
Darius Foster has persistently elevated BPs in the office and at home. We discussed nonpharmacologic approaches to lower BP. I recommend we start HCTZ daily and reassess in 3 months. I will check renal function today.

## 2023-08-20 NOTE — Assessment & Plan Note (Signed)
Stable. Continue to manage with clonazepam 0.5 mg 1-2 times a day as needed.

## 2023-08-20 NOTE — Progress Notes (Signed)
[facility] [address] [place] [address] Dept: [phone] Dept Fax: [phone]  Annual Physical Visit  Subjective:    Patient ID: Darius Foster, male    DOB: 1990-10-14, 32 y.o..   MRN: [id number]  Chief Complaint  Patient presents with   Annual Exam    CPE/Labs.  Fasting today.   Questions about clubbed fingers.    History of Present Illness:  Patient is in today for an annual physical/preventative visit.  Darius Foster has a history of ADHD. He is managed on Adderall 30 mg daily. He has improved focus at work, can better recall tasks, and improved prioritization while on his meds. He is sleeping well and denies any side effects.    Darius Foster also has a history of anxiety that was diagnosed in college, exhibited as panic attacks. He is currently managed on Klonopin. He was previously tried on other medications to treat his anxiety, but either found these ineffective or had intolerable side effects. He notes that his anxiety symptoms fluctuate, but are tolerable. He finds pressure on his job to be a trigger for him.  Review of Systems  Constitutional:  Negative for chills, diaphoresis, fever, malaise/fatigue and weight loss.  HENT:  Negative for congestion, ear pain, hearing loss, sinus pain, sore throat and tinnitus.   Eyes:  Negative for blurred vision, pain, discharge and redness.  Respiratory:  Negative for cough, shortness of breath and wheezing.   Cardiovascular:  Negative for chest pain and palpitations.  Gastrointestinal:  Negative for abdominal pain, constipation, diarrhea, heartburn, nausea and vomiting.  Musculoskeletal:  Negative for back pain, joint pain and myalgias.       Notes he has been told by co-workers that he appears to have clubbing of his fingers. He notes that his mother confirms that he has always had this issue. He has not history of underlying cardiac, pulmonary, GI, liver, or autoimmune  issues.  Skin:  Negative for itching and rash.  Psychiatric/Behavioral:  Negative for depression. The patient is not nervous/anxious.    Past Medical History: Patient Active Problem List   Diagnosis Date Noted   Clubbing of fingers- Congenital 08/20/2023   Primary hypertension 03/19/2023   Erectile dysfunction 03/19/2023   Tinea versicolor 07/03/2021   Anxiety 10/02/2012   Attention deficit disorder 03/26/2007   Past Surgical History:  Procedure Laterality Date   WISDOM TOOTH EXTRACTION     Family History  Problem Relation Age of Onset   Alcohol abuse Father    Heart disease Father    Hypertension Father    Cancer Maternal Grandmother        Lung   Cancer Paternal Grandmother        Breast   Outpatient Medications Prior to Visit  Medication Sig Dispense Refill   amphetamine-dextroamphetamine (ADDERALL) 30 MG tablet Take 1 tablet by mouth 2 (two) times daily. 60 tablet 0   clonazePAM (KLONOPIN) 0.5 MG tablet TAKE 1 TABLET (0.5 MG TOTAL) BY MOUTH 1-2 TIMES A DAY AS NEEDED. 60 tablet 2   ketoconazole (NIZORAL) 2 % cream Apply 1 application topically daily. 30 g 2   Multiple Vitamin (MULTIVITAMIN) tablet Take 1 tablet by mouth daily.     sildenafil (VIAGRA) 50 MG tablet TAKE 1 TABLET(50 MG) BY MOUTH DAILY AS NEEDED FOR ERECTILE DYSFUNCTION 30 tablet 5   No facility-administered medications prior to visit.   No Known Allergies Objective:   Today's Vitals   08/20/23 1001  BP:  130/76  Pulse: 70  Temp: 98.6 F (37 C)  TempSrc: Temporal  SpO2: 98%  Weight: 185 lb 9.6 oz (84.2 kg)  Height: 5\' 10"  (1.778 m)   Body mass index is 26.63 kg/m.   General: Well developed, well nourished. No acute distress. HEENT: Normocephalic, non-traumatic. PERRL, EOMI. Conjunctiva clear. External ears normal. EAC and TMs   normal bilaterally. Nose clear without congestion or rhinorrhea. Mucous membranes moist. Oropharynx clear. Good   dentition. Neck: Supple. No lymphadenopathy. No  thyromegaly. Lungs: Clear to auscultation bilaterally. No wheezing, rales or rhonchi. CV: RRR without murmurs or rubs. Pulses 2+ bilaterally. Abdomen: Soft, non-tender. Bowel sounds positive, normal pitch and frequency. No hepatosplenomegaly. No   rebound or guarding. Back: Straight. No CVA tenderness bilaterally. Extremities: Full ROM. No joint swelling or tenderness. No edema noted. There is clubbing of the distal fingers with   broad, curved nails. Skin: Warm and dry. No rashes. Neuro: CN II-XII intact.  Psych: Alert and oriented. Normal mood and affect.  Health Maintenance Due  Topic Date Due   HIV Screening  Never done   Hepatitis C Screening  Never done     Assessment & Plan:   Problem List Items Addressed This Visit       Cardiovascular and Mediastinum   Primary hypertension    Darius Foster has persistently elevated BPs in the office and at home. We discussed nonpharmacologic approaches to lower BP. I recommend we start HCTZ daily and reassess in 3 months. I will check renal function today.      Relevant Medications   hydrochlorothiazide (HYDRODIURIL) 25 MG tablet   Other Relevant Orders   Basic metabolic panel     Musculoskeletal and Integument   Clubbing of fingers- Congenital    This appears to be an inherited condition for Darius Foster. I see no evidence of other systemic disease that could underlay this. I reassured him regarding this finding.        Other   Anxiety    Stable. Continue to manage with clonazepam 0.5 mg 1-2 times a day as needed.      Attention deficit disorder    Stable. Plan to continue Adderall 30 mg daily.      Other Visit Diagnoses     Annual physical exam    -  Primary   Overall excellent health. I do recommend he start a regular exercise routine. Reviewed recommended screenings and immunizations.       Return in about 3 months (around 11/18/2023) for Reassessment.   Loyola Mast, MD

## 2023-08-20 NOTE — Assessment & Plan Note (Addendum)
This appears to be an inherited condition for Mr. Trundle. I see no evidence of other systemic disease that could underlay this. I reassured him regarding this finding.

## 2023-08-21 ENCOUNTER — Encounter: Payer: Self-pay | Admitting: Family Medicine

## 2023-08-21 NOTE — Telephone Encounter (Signed)
Form printed and placed in providers box to be signed. Dm/cma

## 2023-09-07 ENCOUNTER — Other Ambulatory Visit: Payer: Self-pay | Admitting: Family Medicine

## 2023-09-07 DIAGNOSIS — F900 Attention-deficit hyperactivity disorder, predominantly inattentive type: Secondary | ICD-10-CM

## 2023-09-08 MED ORDER — AMPHETAMINE-DEXTROAMPHETAMINE 30 MG PO TABS
30.0000 mg | ORAL_TABLET | Freq: Two times a day (BID) | ORAL | 0 refills | Status: DC
Start: 2023-09-08 — End: 2023-10-09

## 2023-10-09 ENCOUNTER — Other Ambulatory Visit: Payer: Self-pay | Admitting: Family Medicine

## 2023-10-09 DIAGNOSIS — F900 Attention-deficit hyperactivity disorder, predominantly inattentive type: Secondary | ICD-10-CM

## 2023-10-09 MED ORDER — AMPHETAMINE-DEXTROAMPHETAMINE 30 MG PO TABS
30.0000 mg | ORAL_TABLET | Freq: Two times a day (BID) | ORAL | 0 refills | Status: DC
Start: 2023-10-09 — End: 2023-11-07

## 2023-10-09 NOTE — Telephone Encounter (Signed)
Refill request for  Adderall 30 mg LR  09/08/23, #60, 0 rf LOV  08/20/23 FOV  none scheduled.   Please review and advise.  Thanks. Dm/cma

## 2023-11-07 ENCOUNTER — Other Ambulatory Visit: Payer: Self-pay | Admitting: Family Medicine

## 2023-11-07 DIAGNOSIS — F900 Attention-deficit hyperactivity disorder, predominantly inattentive type: Secondary | ICD-10-CM

## 2023-11-07 NOTE — Telephone Encounter (Signed)
Last Ov 08/20/23 Filled 10/09/23

## 2023-11-10 MED ORDER — AMPHETAMINE-DEXTROAMPHETAMINE 30 MG PO TABS
30.0000 mg | ORAL_TABLET | Freq: Two times a day (BID) | ORAL | 0 refills | Status: DC
Start: 2023-11-10 — End: 2023-12-04

## 2023-11-11 ENCOUNTER — Encounter: Payer: Self-pay | Admitting: Family Medicine

## 2023-11-13 ENCOUNTER — Other Ambulatory Visit: Payer: Self-pay | Admitting: Family Medicine

## 2023-11-13 DIAGNOSIS — F419 Anxiety disorder, unspecified: Secondary | ICD-10-CM

## 2023-11-14 NOTE — Telephone Encounter (Signed)
 Refill request for Clonazapam 0.5 mg LR 08/11/23, #60, 2 rf LOV  08/20/23 FOV none scheduled.    Please review and advise.  Thanks. Dm/cma

## 2023-12-04 ENCOUNTER — Other Ambulatory Visit: Payer: Self-pay | Admitting: Family Medicine

## 2023-12-04 DIAGNOSIS — F900 Attention-deficit hyperactivity disorder, predominantly inattentive type: Secondary | ICD-10-CM

## 2023-12-04 MED ORDER — AMPHETAMINE-DEXTROAMPHETAMINE 30 MG PO TABS
30.0000 mg | ORAL_TABLET | Freq: Two times a day (BID) | ORAL | 0 refills | Status: DC
Start: 2023-12-04 — End: 2024-01-05

## 2023-12-04 NOTE — Telephone Encounter (Signed)
 Refill request for  Adderall 30 mg LR  11/10/23, #60, 0 rf LOV  08/20/23 FOV  none scheduled.  Please review and advise.  Thanks.  Dm/cma

## 2024-01-05 ENCOUNTER — Other Ambulatory Visit: Payer: Self-pay | Admitting: Family Medicine

## 2024-01-05 DIAGNOSIS — F900 Attention-deficit hyperactivity disorder, predominantly inattentive type: Secondary | ICD-10-CM

## 2024-01-05 MED ORDER — AMPHETAMINE-DEXTROAMPHETAMINE 30 MG PO TABS: 30.0000 mg | ORAL_TABLET | Freq: Two times a day (BID) | ORAL | 0 refills | Status: DC

## 2024-01-07 NOTE — Telephone Encounter (Signed)
 Patient scheduled 02/02/24. Dm/cma

## 2024-01-26 ENCOUNTER — Encounter (HOSPITAL_COMMUNITY): Payer: Self-pay

## 2024-02-02 ENCOUNTER — Ambulatory Visit (INDEPENDENT_AMBULATORY_CARE_PROVIDER_SITE_OTHER): Admitting: Family Medicine

## 2024-02-02 ENCOUNTER — Encounter: Payer: Self-pay | Admitting: Family Medicine

## 2024-02-02 VITALS — BP 134/70 | HR 105 | Temp 98.3°F | Ht 70.0 in | Wt 173.8 lb

## 2024-02-02 DIAGNOSIS — B360 Pityriasis versicolor: Secondary | ICD-10-CM

## 2024-02-02 DIAGNOSIS — F900 Attention-deficit hyperactivity disorder, predominantly inattentive type: Secondary | ICD-10-CM

## 2024-02-02 DIAGNOSIS — I10 Essential (primary) hypertension: Secondary | ICD-10-CM

## 2024-02-02 MED ORDER — KETOCONAZOLE 2 % EX CREA: 1.0000 | TOPICAL_CREAM | Freq: Every day | CUTANEOUS | 2 refills | Status: AC

## 2024-02-02 MED ORDER — AMPHETAMINE-DEXTROAMPHETAMINE 30 MG PO TABS: 30.0000 mg | ORAL_TABLET | Freq: Two times a day (BID) | ORAL | 0 refills | Status: DC

## 2024-02-02 NOTE — Assessment & Plan Note (Addendum)
 Mr. Evett BP is in acceptable control.  Continue HCTZ 25 mg dialy.

## 2024-02-02 NOTE — Progress Notes (Signed)
 [facility] [address] [place] [address] Dept: [phone] Dept Fax: [phone]  Chronic Care Office Visit  Subjective:    Patient ID: Darius Foster, male    DOB: 1991-03-08, 33 y.o..   MRN: [id number]  Chief Complaint  Patient presents with   ADHD    F/u ADHD.     History of Present Illness:  Patient is in today for reassessment of chronic medical issues.  Mr. Kindt has a history of ADHD. He is managed on Adderall 30 mg daily. He has improved focus at work, can better recall tasks, and improved prioritization while on his meds. He is sleeping well, has a good appetite and denies any side effects.    Mr. Haselton also has a history of anxiety that was diagnosed in college, exhibited as panic attacks. He is currently managed on clonazepam  0.5 mg. He was previously tried on other medications to treat his anxiety, but either found these ineffective or had intolerable side effects. He notes that his anxiety symptoms fluctuate, but are tolerable. He finds pressure on his job to be a trigger for him.  Mr. Raburn was diagnosed with Stage 1 hypertension in Dec. He is now on HCTZ. He sees some variability in his BP at home.  Past Medical History: Patient Active Problem List   Diagnosis Date Noted   Clubbing of fingers- Congenital 08/20/2023   Primary hypertension 03/19/2023   Erectile dysfunction 03/19/2023   Tinea versicolor 07/03/2021   Anxiety 10/02/2012   Attention deficit disorder 03/26/2007   Past Surgical History:  Procedure Laterality Date   WISDOM TOOTH EXTRACTION     Family History  Problem Relation Age of Onset   Alcohol abuse Father    Heart disease Father    Hypertension Father    Cancer Maternal Grandmother        Lung   Cancer Paternal Grandmother        Breast   Outpatient Medications Prior to Visit  Medication Sig Dispense Refill   clonazePAM  (KLONOPIN ) 0.5 MG tablet TAKE 1 TABLET (0.5  MG TOTAL) BY MOUTH 1-2 TIMES A DAY AS NEEDED. 60 tablet 2   hydrochlorothiazide  (HYDRODIURIL ) 25 MG tablet Take 1 tablet (25 mg total) by mouth daily. 90 tablet 3   Multiple Vitamin (MULTIVITAMIN) tablet Take 1 tablet by mouth daily.     sildenafil  (VIAGRA ) 50 MG tablet TAKE 1 TABLET(50 MG) BY MOUTH DAILY AS NEEDED FOR ERECTILE DYSFUNCTION 30 tablet 5   amphetamine -dextroamphetamine  (ADDERALL) 30 MG tablet Take 1 tablet by mouth 2 (two) times daily. 60 tablet 0   ketoconazole  (NIZORAL ) 2 % cream Apply 1 application topically daily. 30 g 2   No facility-administered medications prior to visit.   No Known Allergies Objective:   Today's Vitals   02/02/24 1301 02/02/24 1330  BP: (!) 146/80 134/70  Pulse: (!) 105   Temp: 98.3 F (36.8 C)   TempSrc: Temporal   SpO2: 95%   Weight: 173 lb 12.8 oz (78.8 kg)   Height: 5\' 10"  (1.778 m)    Body mass index is 24.94 kg/m.   General: Well developed, well nourished. No acute distress. Psych: Alert and oriented. Normal mood and affect.  Health Maintenance Due  Topic Date Due   HIV Screening  Never done   Hepatitis C Screening  Never done     Assessment & Plan:   Problem List Items Addressed This Visit       Cardiovascular and Mediastinum  Primary hypertension - Primary   Mr. Hartis BP is in acceptable control.  Continue HCTZ 25 mg dialy.        Musculoskeletal and Integument   Tinea versicolor   Stable. I will renew ketoconazole  2 % cream.      Relevant Medications   ketoconazole  (NIZORAL ) 2 % cream     Other   Attention deficit disorder   Stable. Plan to continue Adderall 30 mg daily.      Relevant Medications   amphetamine -dextroamphetamine  (ADDERALL) 30 MG tablet    Return in about 3 months (around 05/04/2024) for Reassessment of BP.   Graig Lawyer, MD

## 2024-02-02 NOTE — Assessment & Plan Note (Signed)
Stable. Plan to continue Adderall 30 mg daily.

## 2024-02-02 NOTE — Assessment & Plan Note (Signed)
 Stable. I will renew ketoconazole  2 % cream.

## 2024-02-11 ENCOUNTER — Encounter: Payer: Self-pay | Admitting: Family Medicine

## 2024-02-11 DIAGNOSIS — F900 Attention-deficit hyperactivity disorder, predominantly inattentive type: Secondary | ICD-10-CM

## 2024-02-12 MED ORDER — AMPHETAMINE-DEXTROAMPHETAMINE 30 MG PO TABS: 30.0000 mg | ORAL_TABLET | Freq: Two times a day (BID) | ORAL | 0 refills | Status: DC

## 2024-02-18 ENCOUNTER — Other Ambulatory Visit: Payer: Self-pay | Admitting: Family Medicine

## 2024-02-18 DIAGNOSIS — F419 Anxiety disorder, unspecified: Secondary | ICD-10-CM

## 2024-02-19 NOTE — Telephone Encounter (Signed)
 Refill request for  Clonazepam  0.5 mg LR  11/14/23, #60, 2 rf LOV  01/29/24 FOV   none scheduled.    Please review and advise.  Thanks. Dm/cma

## 2024-03-02 ENCOUNTER — Encounter: Payer: Self-pay | Admitting: Family Medicine

## 2024-03-02 DIAGNOSIS — F900 Attention-deficit hyperactivity disorder, predominantly inattentive type: Secondary | ICD-10-CM

## 2024-03-02 MED ORDER — AMPHETAMINE-DEXTROAMPHETAMINE 30 MG PO TABS
30.0000 mg | ORAL_TABLET | Freq: Two times a day (BID) | ORAL | 0 refills | Status: DC
Start: 2024-03-02 — End: 2024-03-04

## 2024-03-02 NOTE — Telephone Encounter (Signed)
Please review message and advise.   Thanks. Dm/cma  

## 2024-03-04 MED ORDER — AMPHETAMINE-DEXTROAMPHETAMINE 30 MG PO TABS: 30.0000 mg | ORAL_TABLET | Freq: Two times a day (BID) | ORAL | 0 refills | Status: DC

## 2024-03-04 NOTE — Addendum Note (Signed)
 Addended by: Vergil Glasser on: 03/04/2024 07:57 AM   Modules accepted: Orders

## 2024-03-04 NOTE — Telephone Encounter (Signed)
 [facility] is out of stock can you resend to [facility]? Thanks. Dm/cma

## 2024-03-17 ENCOUNTER — Encounter: Payer: Self-pay | Admitting: Family Medicine

## 2024-04-01 ENCOUNTER — Other Ambulatory Visit: Payer: Self-pay | Admitting: Family Medicine

## 2024-04-01 ENCOUNTER — Encounter: Payer: Self-pay | Admitting: Family Medicine

## 2024-04-01 DIAGNOSIS — F900 Attention-deficit hyperactivity disorder, predominantly inattentive type: Secondary | ICD-10-CM

## 2024-04-01 DIAGNOSIS — N529 Male erectile dysfunction, unspecified: Secondary | ICD-10-CM

## 2024-04-01 MED ORDER — AMPHETAMINE-DEXTROAMPHETAMINE 30 MG PO TABS
30.0000 mg | ORAL_TABLET | Freq: Two times a day (BID) | ORAL | 0 refills | Status: DC
Start: 2024-04-01 — End: 2024-05-03

## 2024-04-01 NOTE — Telephone Encounter (Signed)
 Refill request for  Adderall 30 mg LR 03/04/24, #60, 0 rf LOV  02/02/24 FOV none scheduled.     Please review and advise.  Thanks. Dm/cma

## 2024-05-03 ENCOUNTER — Encounter: Payer: Self-pay | Admitting: Family Medicine

## 2024-05-03 DIAGNOSIS — F900 Attention-deficit hyperactivity disorder, predominantly inattentive type: Secondary | ICD-10-CM

## 2024-05-03 MED ORDER — AMPHETAMINE-DEXTROAMPHETAMINE 30 MG PO TABS: 30.0000 mg | ORAL_TABLET | Freq: Two times a day (BID) | ORAL | 0 refills | Status: DC

## 2024-05-03 NOTE — Telephone Encounter (Signed)
 Refill request for  Adderall 30 mg LR 04/01/24, #60, 0 rf LOV 02/02/24 FOV  none scheduled.  Please review and advise  Thanks. Dm/cma

## 2024-05-18 ENCOUNTER — Ambulatory Visit (INDEPENDENT_AMBULATORY_CARE_PROVIDER_SITE_OTHER): Admitting: Family Medicine

## 2024-05-18 ENCOUNTER — Encounter: Payer: Self-pay | Admitting: Family Medicine

## 2024-05-18 VITALS — BP 150/76 | HR 106 | Temp 97.8°F | Ht 70.0 in | Wt 183.4 lb

## 2024-05-18 DIAGNOSIS — L608 Other nail disorders: Secondary | ICD-10-CM | POA: Diagnosis not present

## 2024-05-18 DIAGNOSIS — I10 Essential (primary) hypertension: Secondary | ICD-10-CM

## 2024-05-18 DIAGNOSIS — F419 Anxiety disorder, unspecified: Secondary | ICD-10-CM

## 2024-05-18 DIAGNOSIS — F900 Attention-deficit hyperactivity disorder, predominantly inattentive type: Secondary | ICD-10-CM

## 2024-05-18 MED ORDER — AMPHETAMINE-DEXTROAMPHETAMINE 30 MG PO TABS: 30.0000 mg | ORAL_TABLET | Freq: Two times a day (BID) | ORAL | 0 refills | Status: DC

## 2024-05-18 MED ORDER — CLONAZEPAM 0.5 MG PO TABS: ORAL_TABLET | ORAL | 2 refills | Status: DC

## 2024-05-18 NOTE — Assessment & Plan Note (Signed)
 Nail findings are consistent with splinter hemorrhages. He has no sign of endocarditis, and CTD, APL syndrome or chronic renal failure. 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

## 2024-05-18 NOTE — Assessment & Plan Note (Signed)
 Darius Foster BP is running higher today, however he is having some acute anxiety. His home BPs are in acceptable control. Continue HCTZ 25 mg dialy. We will monitor this for now.

## 2024-05-18 NOTE — Progress Notes (Signed)
 [facility] [address] [place] [address] Dept: [phone] Dept Fax: [phone]  Chronic Care Office Visit  Subjective:    Patient ID: Darius Foster, male    DOB: 05-25-1991, 33 y.o..   MRN: [id number]  Chief Complaint  Patient presents with   Hypertension    F/u HTN and ADHD.  C/o having lines on finger nails.     History of Present Illness:  Patient is in today for reassessment of chronic medical issues.  Mr. Darius Foster has a history of ADHD. He is managed on Adderall 30 mg daily. He has improved focus at work, can better recall tasks, and improved prioritization while on his meds. He is sleeping well, has a good appetite and denies any side effects.    Mr. Darius Foster also has a history of anxiety that was diagnosed in college, exhibited as panic attacks. He is currently managed on clonazepam  0.5 mg.   Mr. Darius Foster was diagnosed with Stage 1 hypertension in Dec. He is now on HCTZ. His home BP ranges from 132-142/77-90. He ntoes he has not taken his clonazepam  today, so admits to feeling some anxiousness.  Past Medical History: Patient Active Problem List   Diagnosis Date Noted   Splinter hemorrhage of fingernail 05/18/2024   Clubbing of fingers- Congenital 08/20/2023   Primary hypertension 03/19/2023   Erectile dysfunction 03/19/2023   Tinea versicolor 07/03/2021   Anxiety 10/02/2012   Attention deficit disorder 03/26/2007   Past Surgical History:  Procedure Laterality Date   WISDOM TOOTH EXTRACTION     Family History  Problem Relation Age of Onset   Alcohol abuse Father    Heart disease Father    Hypertension Father    Cancer Maternal Grandmother        Lung   Cancer Paternal Grandmother        Breast   Outpatient Medications Prior to Visit  Medication Sig Dispense Refill   clonazePAM  (KLONOPIN ) 0.5 MG tablet TAKE 1 TABLET (0.5 MG TOTAL) BY MOUTH 1-2 TIMES A DAY AS NEEDED. 60 tablet 2    hydrochlorothiazide  (HYDRODIURIL ) 25 MG tablet Take 1 tablet (25 mg total) by mouth daily. 90 tablet 3   ketoconazole  (NIZORAL ) 2 % cream Apply 1 Application topically daily. 30 g 2   Multiple Vitamin (MULTIVITAMIN) tablet Take 1 tablet by mouth daily.     sildenafil  (VIAGRA ) 50 MG tablet TAKE 1 TABLET BY MOUTH DAILY AS NEEDED FOR ERECTILE DYSFUNCTION 6 tablet 29   amphetamine -dextroamphetamine  (ADDERALL) 30 MG tablet Take 1 tablet by mouth 2 (two) times daily. 60 tablet 0   No facility-administered medications prior to visit.   No Known Allergies Objective:   Today's Vitals   05/18/24 1507 05/18/24 1547  BP: (!) 150/80 (!) 150/76  Pulse: (!) 106   Temp: 97.8 F (36.6 C)   TempSrc: Temporal   SpO2: 97%   Weight: 183 lb 6.4 oz (83.2 kg)   Height: 5' 10 (1.778 m)    Body mass index is 26.32 kg/m.   General: Well developed, well nourished. No acute distress. Nails: several of the nails show small dark horizontal lines under the nail plate. Psych: Alert and oriented. Normal mood and affect.  Health Maintenance Due  Topic Date Due   HIV Screening  Never done   Hepatitis C Screening  Never done   HPV VACCINES (1 - 3-dose SCDM series) Never done     Assessment & Plan:   Problem List Items Addressed  This Visit       Cardiovascular and Mediastinum   Primary hypertension - Primary   Mr. Darius Foster BP is running higher today, however he is having some acute anxiety. His home BPs are in acceptable control. Continue HCTZ 25 mg dialy. We will monitor this for now.      Relevant Orders   Basic metabolic panel with GFR     Other   Anxiety   Stable. Continue to manage with clonazepam  0.5 mg 1-2 times a day as needed.      Relevant Medications   clonazePAM  (KLONOPIN ) 0.5 MG tablet   Attention deficit disorder   Stable. Plan to continue Adderall 30 mg daily. We will monitor his BP and pulse response.      Relevant Medications   amphetamine -dextroamphetamine  (ADDERALL) 30 MG  tablet   Splinter hemorrhage of fingernail   Nail findings are consistent with splinter hemorrhages. He has no sign of endocarditis, and CTD, APL syndrome or chronic renal failure. 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   Darius CHRISTELLA Simpler, MD

## 2024-05-18 NOTE — Assessment & Plan Note (Addendum)
 Stable. Plan to continue Adderall 30 mg daily. We will monitor his BP and pulse response.

## 2024-05-18 NOTE — Assessment & Plan Note (Signed)
 Stable. Continue to manage with clonazepam 0.5 mg 1-2 times a day as needed.

## 2024-05-19 ENCOUNTER — Ambulatory Visit: Payer: Self-pay | Admitting: Family Medicine

## 2024-05-19 LAB — BASIC METABOLIC PANEL WITH GFR
BUN: 15 mg/dL (ref 6–23)
CO2: 28 meq/L (ref 19–32)
Calcium: 10.6 mg/dL — ABNORMAL HIGH (ref 8.4–10.5)
Chloride: 92 meq/L — ABNORMAL LOW (ref 96–112)
Creatinine, Ser: 0.83 mg/dL (ref 0.40–1.50)
GFR: 113.41 mL/min (ref >60.00)
Glucose, Bld: 97 mg/dL (ref 70–99)
Potassium: 3.7 meq/L (ref 3.5–5.1)
Sodium: 135 meq/L (ref 135–145)

## 2024-06-29 ENCOUNTER — Encounter: Payer: Self-pay | Admitting: Family Medicine

## 2024-06-29 DIAGNOSIS — F900 Attention-deficit hyperactivity disorder, predominantly inattentive type: Secondary | ICD-10-CM

## 2024-06-29 MED ORDER — AMPHETAMINE-DEXTROAMPHETAMINE 30 MG PO TABS: 30.0000 mg | ORAL_TABLET | Freq: Two times a day (BID) | ORAL | 0 refills | Status: DC

## 2024-06-29 NOTE — Telephone Encounter (Signed)
 Refill request for  Aderall LR  05/18/24, #60, 0 rf LOV 05/18/24 FOV  08/20/24  Please review and advise.  Thanks. Dm/cma

## 2024-07-30 ENCOUNTER — Encounter: Payer: Self-pay | Admitting: Family Medicine

## 2024-07-30 DIAGNOSIS — F900 Attention-deficit hyperactivity disorder, predominantly inattentive type: Secondary | ICD-10-CM

## 2024-07-30 NOTE — Telephone Encounter (Signed)
 Refill request for  Adderall 30 mg LR 06/29/24, #60, 0 rf LOV 05/18/24 FOV  08/20/24   Please review and advise .  Thanks.  Dm/cma

## 2024-08-02 MED ORDER — AMPHETAMINE-DEXTROAMPHETAMINE 30 MG PO TABS: 30.0000 mg | ORAL_TABLET | Freq: Two times a day (BID) | ORAL | 0 refills | Status: DC

## 2024-08-20 ENCOUNTER — Ambulatory Visit: Admitting: Family Medicine

## 2024-08-20 ENCOUNTER — Encounter: Payer: Self-pay | Admitting: Family Medicine

## 2024-08-20 VITALS — BP 134/78 | HR 90 | Temp 98.8°F | Ht 70.0 in | Wt 186.6 lb

## 2024-08-20 DIAGNOSIS — M5431 Sciatica, right side: Secondary | ICD-10-CM

## 2024-08-20 DIAGNOSIS — Z0000 Encounter for general adult medical examination without abnormal findings: Secondary | ICD-10-CM | POA: Diagnosis not present

## 2024-08-20 DIAGNOSIS — I10 Essential (primary) hypertension: Secondary | ICD-10-CM | POA: Diagnosis not present

## 2024-08-20 DIAGNOSIS — N529 Male erectile dysfunction, unspecified: Secondary | ICD-10-CM

## 2024-08-20 DIAGNOSIS — F419 Anxiety disorder, unspecified: Secondary | ICD-10-CM | POA: Diagnosis not present

## 2024-08-20 DIAGNOSIS — F900 Attention-deficit hyperactivity disorder, predominantly inattentive type: Secondary | ICD-10-CM

## 2024-08-20 MED ORDER — AMPHETAMINE-DEXTROAMPHETAMINE 30 MG PO TABS: 30.0000 mg | ORAL_TABLET | Freq: Two times a day (BID) | ORAL | 0 refills | Status: DC

## 2024-08-20 MED ORDER — CLONAZEPAM 0.5 MG PO TABS: ORAL_TABLET | ORAL | 2 refills | Status: AC

## 2024-08-20 MED ORDER — SILDENAFIL CITRATE 50 MG PO TABS: ORAL_TABLET | ORAL | 5 refills | Status: AC

## 2024-08-20 NOTE — Assessment & Plan Note (Signed)
 Stable. Continue to manage with clonazepam 0.5 mg 1-2 times a day as needed.

## 2024-08-20 NOTE — Progress Notes (Signed)
 [facility] [address] [place] [address] Dept: [phone] Dept Fax: [phone]  Annual Physical Visit  Subjective:    Patient ID: Darius Foster [place]on, male    DOB: 20-Jul-1991, 33 y.o..   MRN: [id number]  Chief Complaint  Patient presents with   Annual Exam    CPE/labs.  C/o having low back pain radiating into the Rt leg.     History of Present Illness:  Patient is in today for an annual physical/preventative visit.  Mr. Tuzzolino has a history of ADHD. He is managed on Adderall 30 mg daily. He has improved focus at work, can better recall tasks, and improved prioritization while on his meds.    Mr. Thieme has a history of anxiety that was diagnosed in college, exhibited as panic attacks. He is currently managed on clonazepam  0.5 mg.   Mr. Amorin was diagnosed with Stage 1 hypertension in Dec 2024. He is managed on HCTZ 25 mg daily.   Review of Systems  Constitutional:  Negative for chills, diaphoresis, fever, malaise/fatigue and weight loss.  HENT:  Negative for congestion, ear pain, hearing loss, sinus pain, sore throat and tinnitus.   Eyes:  Negative for blurred vision, pain, discharge and redness.  Respiratory:  Negative for cough, shortness of breath and wheezing.   Cardiovascular:  Negative for chest pain and palpitations.  Gastrointestinal:  Negative for abdominal pain, constipation, diarrhea, heartburn, nausea and vomiting.  Musculoskeletal:  Positive for back pain. Negative for joint pain and myalgias.       Notes lower back pain that has progressed to sciatica radiating down the posterior right buttocks and thigh. As his wife works for a land, he has been going for regular spinal adjustments for the past 2 months. He does not feel like his issue is resolving. He denies any lower extremity weakness, numbness, or tingling. No bowel or bladder incontinence.  Skin:  Negative for itching and rash.   Psychiatric/Behavioral:  Negative for depression. The patient is not nervous/anxious.    Past Medical History: Patient Active Problem List   Diagnosis Date Noted   Splinter hemorrhage of fingernail 05/18/2024   Clubbing of fingers- Congenital 08/20/2023   Primary hypertension 03/19/2023   Erectile dysfunction 03/19/2023   Tinea versicolor 07/03/2021   Anxiety 10/02/2012   Attention deficit disorder 03/26/2007   Past Surgical History:  Procedure Laterality Date   WISDOM TOOTH EXTRACTION     Family History  Problem Relation Age of Onset   Alcohol abuse Father    Heart disease Father    Hypertension Father    Cancer Maternal Grandmother        Lung   Cancer Paternal Grandmother        Breast   Outpatient Medications Prior to Visit  Medication Sig Dispense Refill   amphetamine -dextroamphetamine  (ADDERALL) 30 MG tablet Take 1 tablet by mouth 2 (two) times daily. 60 tablet 0   clonazePAM  (KLONOPIN ) 0.5 MG tablet TAKE 1 TABLET (0.5 MG TOTAL) BY MOUTH 1-2 TIMES A DAY AS NEEDED. 60 tablet 2   hydrochlorothiazide  (HYDRODIURIL ) 25 MG tablet Take 1 tablet (25 mg total) by mouth daily. 90 tablet 3   ketoconazole  (NIZORAL ) 2 % cream Apply 1 Application topically daily. 30 g 2   Multiple Vitamin (MULTIVITAMIN) tablet Take 1 tablet by mouth daily.     sildenafil  (VIAGRA ) 50 MG tablet TAKE 1 TABLET BY MOUTH DAILY AS NEEDED FOR ERECTILE DYSFUNCTION 6 tablet 29   No facility-administered medications  prior to visit.   No Known Allergies Objective:   Today's Vitals   08/20/24 1540  BP: 134/78  Pulse: 90  Temp: 98.8 F (37.1 C)  TempSrc: Temporal  SpO2: 97%  Weight: 186 lb 9.6 oz (84.6 kg)  Height: 5' 10 (1.778 m)   Body mass index is 26.77 kg/m.   General: Well developed, well nourished. No acute distress. HEENT: Normocephalic, non-traumatic. PERRL, EOMI. Conjunctiva clear. External ears normal. EAC and TMs normal   bilaterally. Nose clear without congestion or rhinorrhea.  Mucous membranes moist. Oropharynx clear. Good dentition. Neck: Supple. No lymphadenopathy. No thyromegaly. Lungs: Clear to auscultation bilaterally. No wheezing, rales or rhonchi. CV: RRR without murmurs or rubs. Pulses 2+ bilaterally. Abdomen: Soft, non-tender. Bowel sounds positive, normal pitch and frequency. No hepatosplenomegaly. No rebound or   guarding. Extremities: Full ROM. No joint swelling or tenderness. No edema noted. LE strength 5/5. Skin: Warm and dry. No rashes. Neuro: No focal neurological deficits. DTR 3+ patellar, 1+ Achilles. Sensation normal. Psych: Alert and oriented. Normal mood and affect.  Health Maintenance Due  Topic Date Due   HIV Screening  Never done   Hepatitis C Screening  Never done   HPV VACCINES (1 - 3-dose SCDM series) Never done   Influenza Vaccine  04/16/2024   COVID-19 Vaccine (1 - 2025-26 season) Never done     Assessment & Plan:   Problem List Items Addressed This Visit       Cardiovascular and Mediastinum   Primary hypertension   His home BPs are in acceptable control. Continue HCTZ 25 mg daily.      Relevant Medications   sildenafil  (VIAGRA ) 50 MG tablet     Other   Annual physical exam - Primary   Overall health is very good. Recommend regular exercise. Discussed recommended screenings and immunizations.       Anxiety   Stable. Continue to manage with clonazepam  0.5 mg 1-2 times a day as needed.      Relevant Medications   clonazePAM  (KLONOPIN ) 0.5 MG tablet   Attention deficit disorder   Stable. Plan to continue Adderall 30 mg daily. We will monitor his BP and pulse response.      Relevant Medications   amphetamine -dextroamphetamine  (ADDERALL) 30 MG tablet   Erectile dysfunction   Relevant Medications   sildenafil  (VIAGRA ) 50 MG tablet   Other Visit Diagnoses       Sciatica of right side       I will refer to PT.   Relevant Medications   clonazePAM  (KLONOPIN ) 0.5 MG tablet   amphetamine -dextroamphetamine   (ADDERALL) 30 MG tablet   Other Relevant Orders   Ambulatory referral to Physical Therapy       Return in about 3 months (around 11/18/2024) for Reassessment.   Garnette CHRISTELLA Simpler, MD  I,Emily Lagle,acting as a scribe for Garnette CHRISTELLA Simpler, MD.,have documented all relevant documentation on the behalf of Garnette CHRISTELLA Simpler, MD.  I, Garnette CHRISTELLA Simpler, MD, have reviewed all documentation for this visit. The documentation on 08/20/2024 for the exam, diagnosis, procedures, and orders are all accurate and complete.

## 2024-08-20 NOTE — Assessment & Plan Note (Addendum)
 His home BPs are in acceptable control. Continue HCTZ 25 mg daily.

## 2024-08-20 NOTE — Assessment & Plan Note (Signed)
 Stable. Plan to continue Adderall 30 mg daily. We will monitor his BP and pulse response.

## 2024-08-20 NOTE — Assessment & Plan Note (Signed)
 Overall health is very good. Recommend regular exercise. Discussed recommended screenings and immunizations.

## 2024-08-24 ENCOUNTER — Other Ambulatory Visit: Payer: Self-pay | Admitting: Family Medicine

## 2024-08-24 DIAGNOSIS — I10 Essential (primary) hypertension: Secondary | ICD-10-CM

## 2024-08-26 DIAGNOSIS — M5416 Radiculopathy, lumbar region: Secondary | ICD-10-CM | POA: Diagnosis not present

## 2024-09-22 ENCOUNTER — Other Ambulatory Visit: Payer: Self-pay

## 2024-09-22 ENCOUNTER — Ambulatory Visit: Attending: Family Medicine | Admitting: Physical Therapy

## 2024-09-22 ENCOUNTER — Encounter: Payer: Self-pay | Admitting: Physical Therapy

## 2024-09-22 DIAGNOSIS — M5431 Sciatica, right side: Secondary | ICD-10-CM | POA: Insufficient documentation

## 2024-09-22 DIAGNOSIS — M62830 Muscle spasm of back: Secondary | ICD-10-CM | POA: Insufficient documentation

## 2024-09-22 DIAGNOSIS — M5459 Other low back pain: Secondary | ICD-10-CM | POA: Diagnosis present

## 2024-09-22 DIAGNOSIS — M5416 Radiculopathy, lumbar region: Secondary | ICD-10-CM | POA: Diagnosis present

## 2024-09-22 NOTE — Therapy (Signed)
 " OUTPATIENT PHYSICAL THERAPY THORACOLUMBAR EVALUATION   Patient Name: Darius Foster MRN: [id number] DOB:Aug 14, 1991, 34 y.o., male Today's Date: 09/22/2024  END OF SESSION:  PT End of Session - 09/22/24 1534     Visit Number 1    Number of Visits 30    Date for Recertification  12/21/24    Authorization Type [employer]    PT Start Time 1530    PT Stop Time 1615    PT Time Calculation (min) 45 min    Activity Tolerance Patient tolerated treatment well    Behavior During Therapy [facility] for tasks assessed/performed          Past Medical History:  Diagnosis Date   ADD    Anxiety 10/02/2012   CARDIAC MURMUR    ECHO (-) 2008, saw cards innocent murmur   Past Surgical History:  Procedure Laterality Date   WISDOM TOOTH EXTRACTION     Patient Active Problem List   Diagnosis Date Noted   Annual physical exam 08/20/2024   Splinter hemorrhage of fingernail 05/18/2024   Clubbing of fingers- Congenital 08/20/2023   Primary hypertension 03/19/2023   Erectile dysfunction 03/19/2023   Tinea versicolor 07/03/2021   Anxiety 10/02/2012   Attention deficit disorder 03/26/2007    PCP: Thedora, MD  REFERRING PROVIDER: Thedora, MD  REFERRING DIAG: sciatica  Rationale for Evaluation and Treatment: Rehabilitation  THERAPY DIAG:  Other low back pain  Radiculopathy, lumbar region  Muscle spasm of back  ONSET DATE: 08/20/24  SUBJECTIVE:                                                                                                                                                                                           SUBJECTIVE STATEMENT: Patient reports that he has had some off and on pain in the legs, reports that about 6 months ago he was lifting some heavy things at home and has had some back pain and bilateral leg pain.  PERTINENT HISTORY:  ADD, anxiety  PAIN:  Are you having pain? Yes: NPRS scale: 3/10 Pain location: low back and into legs, right shin is sore to touch Pain  description: ache Aggravating factors: standing, lifting, end of day pain up to 9/10 Relieving factors: advil, changes in position, rest prednisone  at best pain a 1-2/10  PRECAUTIONS: None  RED FLAGS: None   WEIGHT BEARING RESTRICTIONS: No  FALLS:  Has patient fallen in last 6 months? No  LIVING ENVIRONMENT: Lives with: lives with their family Lives in: House/apartment Stairs: No Has following equipment at home: None  OCCUPATION: Welder  PLOF: Independent yard work, housework  PATIENT GOALS: have  less pain  NEXT MD VISIT: 3 months  OBJECTIVE:  Note: Objective measures were completed at Evaluation unless otherwise noted.  DIAGNOSTIC FINDINGS:  X-rays showed DDD L5-S1  COGNITION: Overall cognitive status: Within functional limits for tasks assessed     SENSATION: WFL Tender int he right lateral shin  MUSCLE LENGTH: SLR 45 degrees with pain Tight piriformis  POSTURE: rounded shoulders, forward head, and decreased lumbar lordosis  PALPATION: Tightness in the low back, tender right lateral shin  LUMBAR ROM:   AROM eval  Flexion Decreased 50%  Extension Decreased 25%  Right lateral flexion Decreased 25%  Left lateral flexion Decreased 25%  Right rotation   Left rotation    (Blank rows = not tested)  LOWER EXTREMITY ROM:     Very tight mms in the LE's but ROM WFL's  LOWER EXTREMITY MMT:   WFL's but reports thjat when he hurts he gives to pain LUMBAR SPECIAL TESTS:  Prone instability test: Negative, Straight leg raise test: Positive, and SI Compression/distraction test: Negative  GAIT: Distance walked: 100 feet Assistive device utilized: None Level of assistance: Complete Independence Comments: a little stiff in the pelvis  TREATMENT DATE:  09/22/24 Evaluation, HEP                                                                                                                                 PATIENT EDUCATION:  Education details:  POC/HEP Person educated: Patient Education method: Programmer, Multimedia, Demonstration, Actor cues, Verbal cues, and Handouts Education comprehension: verbalized understanding  HOME EXERCISE PROGRAM: Access Code: [email or web address] URL: [email or web address] Date: 09/22/2024 Prepared by: Ozell Mainland  Exercises - Supine Lower Trunk Rotation  - 2 x daily - 7 x weekly - 1 sets - 10 reps - 3 hold - Supine Piriformis Stretch with Foot on Ground  - 2 x daily - 7 x weekly - 1 sets - 5 reps - 30 hold - Supine Double Knee to Chest Modified  - 2 x daily - 7 x weekly - 1 sets - 10 reps - 3 hold - Prone on Elbows Stretch  - 2 x daily - 7 x weekly - 1 sets - 2 reps - 30-60 hold - Standing Lumbar Extension at Wall - Forearms  - 2 x daily - 7 x weekly - 1 sets - 5 reps - 10 hold - Standing Lumbar Extension  - 1 x daily - 7 x weekly - 1 sets - 10 reps - 3 hold  ASSESSMENT:  CLINICAL IMPRESSION: Patient is a 34 y.o. male who was seen today for physical therapy evaluation and treatment for lumbar radiculopathy.  He feels it started in October after lifting something heavy at home.  He reports that he has pain in both legs at times but typically right LE, he has tenderness and an ache in the right lateral shin.  He is very tight in the HS, mild tightness in the calves and piriformis.  He had injections in the back and did a round of prednisone, reports that it helped for a while.  X-rays show DDD at L5-S1 but MD feels it could be some nerve impingement.  OBJECTIVE IMPAIRMENTS: Abnormal gait, cardiopulmonary status limiting activity, decreased activity tolerance, decreased balance, decreased endurance, decreased mobility, difficulty walking, decreased ROM, decreased strength, increased fascial restrictions, impaired perceived functional ability, increased muscle spasms, impaired flexibility, improper body mechanics, postural dysfunction, and pain.   REHAB POTENTIAL: Good  CLINICAL DECISION MAKING:  Stable/uncomplicated  EVALUATION COMPLEXITY: Low   GOALS: Goals reviewed with patient? No  SHORT TERM GOALS: Target date: 10/12/24  Independent with initial HEP Baseline: Goal status: INITIAL  LONG TERM GOALS: Target date: 12/21/24  Independent with advanced HEP Baseline:  Goal status: INITIAL  2.  Understand posture and body mechanics Baseline:  Goal status: INITIAL  3.  Improve SLR to 70 degrees  Baseline: 45 degrees Goal status: INITIAL  4.  Report pain decreased 50% Baseline:  Goal status: INITIAL  5.  Improve Lumbar ROM 25% Baseline:  Goal status: INITIAL  PLAN:  PT FREQUENCY: 1x/week  PT DURATION: 12 weeks  PLANNED INTERVENTIONS: 97164- PT Re-evaluation, 97110-Therapeutic exercises, 97530- Therapeutic activity, [id number]- Neuromuscular re-education, 97535- Self Care, [id number]- Manual therapy, [id number]- Electrical stimulation (unattended), 97035- Ultrasound, 02987- Traction (mechanical), Patient/Family education, Taping, Joint mobilization, Spinal mobilization, Cryotherapy, and Moist heat.  PLAN FOR NEXT SESSION: flexibility, core work, surveyor, minerals   Darius Foster, PT 09/22/2024, 3:35 PM  "

## 2024-09-28 ENCOUNTER — Encounter: Payer: Self-pay | Admitting: Family Medicine

## 2024-09-28 DIAGNOSIS — F900 Attention-deficit hyperactivity disorder, predominantly inattentive type: Secondary | ICD-10-CM

## 2024-09-28 MED ORDER — AMPHETAMINE-DEXTROAMPHETAMINE 30 MG PO TABS: 30.0000 mg | ORAL_TABLET | Freq: Two times a day (BID) | ORAL | 0 refills | Status: AC

## 2024-09-28 NOTE — Telephone Encounter (Signed)
 Refill request for  Adderall 30 mg LR  08/20/24, #60, 0 rf LOV  08/20/24 FOV  11/29/24  Please review and advise.  Thanks. Dm/cma

## 2024-10-05 ENCOUNTER — Ambulatory Visit: Admitting: Physical Therapy

## 2024-10-14 ENCOUNTER — Ambulatory Visit: Admitting: Physical Therapy

## 2024-10-14 DIAGNOSIS — M5416 Radiculopathy, lumbar region: Secondary | ICD-10-CM

## 2024-10-14 DIAGNOSIS — M62830 Muscle spasm of back: Secondary | ICD-10-CM

## 2024-10-14 DIAGNOSIS — M5459 Other low back pain: Secondary | ICD-10-CM

## 2024-10-14 NOTE — Therapy (Signed)
 " OUTPATIENT PHYSICAL THERAPY THORACOLUMBAR    Patient Name: Darius Foster MRN: [id number] DOB:September 06, 1991, 34 y.o., male Today's Date: 10/14/2024  END OF SESSION:  PT End of Session - 10/14/24 1524     Visit Number 2    Number of Visits 30    Date for Recertification  12/21/24    Authorization Type BCBS    PT Start Time 1525    PT Stop Time 1610    PT Time Calculation (min) 45 min          Past Medical History:  Diagnosis Date   ADD    Anxiety 10/02/2012   CARDIAC MURMUR    ECHO (-) 2008, saw cards innocent murmur   Past Surgical History:  Procedure Laterality Date   WISDOM TOOTH EXTRACTION     Patient Active Problem List   Diagnosis Date Noted   Annual physical exam 08/20/2024   Splinter hemorrhage of fingernail 05/18/2024   Clubbing of fingers- Congenital 08/20/2023   Primary hypertension 03/19/2023   Erectile dysfunction 03/19/2023   Tinea versicolor 07/03/2021   Anxiety 10/02/2012   Attention deficit disorder 03/26/2007    PCP: Thedora, MD  REFERRING PROVIDER: Thedora, MD  REFERRING DIAG: sciatica  Rationale for Evaluation and Treatment: Rehabilitation  THERAPY DIAG:  Other low back pain  Radiculopathy, lumbar region  Muscle spasm of back  ONSET DATE: 08/20/24  SUBJECTIVE:                                                                                                                                                                                           SUBJECTIVE STATEMENT: stretching is helping,feeling better. BIL radiating RT> Left but inconsistent Patient reports that he has had some off and on pain in the legs, reports that about 6 months ago he was lifting some heavy things at home and has had some back pain and bilateral leg pain.  PERTINENT HISTORY:  ADD, anxiety  PAIN:  Are you having pain? Yes: NPRS scale: 3/10 Pain location: low back and into legs, right shin is sore to touch Pain description: ache Aggravating factors: standing,  lifting, end of day pain up to 9/10 Relieving factors: advil, changes in position, rest prednisone  at best pain a 1-2/10  PRECAUTIONS: None  RED FLAGS: None   WEIGHT BEARING RESTRICTIONS: No  FALLS:  Has patient fallen in last 6 months? No  LIVING ENVIRONMENT: Lives with: lives with their family Lives in: House/apartment Stairs: No Has following equipment at home: None  OCCUPATION: Welder  PLOF: Independent yard work, housework  PATIENT GOALS: have less pain  NEXT MD VISIT: 3 months  OBJECTIVE:  Note: Objective measures were completed at Evaluation unless otherwise noted.  DIAGNOSTIC FINDINGS:  X-rays showed DDD L5-S1  COGNITION: Overall cognitive status: Within functional limits for tasks assessed     SENSATION: WFL Tender int he right lateral shin  MUSCLE LENGTH: SLR 45 degrees with pain Tight piriformis  POSTURE: rounded shoulders, forward head, and decreased lumbar lordosis  PALPATION: Tightness in the low back, tender right lateral shin  LUMBAR ROM:   AROM eval 10/14/24  Flexion Decreased 50% Decreased 25%  Extension Decreased 25% Decreased 25%  Right lateral flexion Decreased 25% Decreased 25%  Left lateral flexion Decreased 25% Decreased 25%  Right rotation    Left rotation     (Blank rows = not tested)  LOWER EXTREMITY ROM:     Very tight mms in the LE's but ROM WFL's  LOWER EXTREMITY MMT:   WFL's but reports thjat when he hurts he gives to pain LUMBAR SPECIAL TESTS:  Prone instability test: Negative, Straight leg raise test: Positive, and SI Compression/distraction test: Negative  GAIT: Distance walked: 100 feet Assistive device utilized: None Level of assistance: Complete Independence Comments: a little stiff in the pelvis  TREATMENT DATE:   10/14/24 Nustep L 5 5 min Goals and AROM addressed- see documentation  Feet on ball bridge, KTC and obl Supine isometric abdominals upper and lower with ball 10 x each PROM LE and trunk (  supine and prone) NT stetching RT LE which was positive and showed to do at home Prone opp arm and leg 20x Prone superman 10 x STS with wt ball OH press 10 x Educ on ext when radiating pain comes to see if ext takes it away as he favors ext mvmt Green tband shld ext and row 15x standing Green tband hip ext and abd 2 set 10 standing Standing trunk ext 10x with green tband Bridge Added to HEP    09/22/24 Evaluation, HEP                                                                                                                                 PATIENT EDUCATION:  Education details: POC/HEP Person educated: Patient Education method: Programmer, Multimedia, Demonstration, Actor cues, Verbal cues, and Handouts Education comprehension: verbalized understanding  HOME EXERCISE PROGRAM: Access Code: [email or web address] URL: [email or web address] Date: 10/14/2024 Prepared by: Mallery Harshman  Exercises - Shoulder extension with resistance - Neutral  - 1 x daily - 7 x weekly - 2 sets - 10 reps - Standing Shoulder Row with Anchored Resistance  - 1 x daily - 7 x weekly - 2 sets - 10 reps - Hip Extension with Resistance Loop  - 1 x daily - 7 x weekly - 2 sets - 10 reps - Hip Abduction with Resistance Loop  - 1 x daily - 7 x weekly - 2 sets - 10 reps - Full Superman on Table  - 1 x daily - 7 x weekly -  2 sets - 10 reps - Prone Alternating Arm and Leg Lifts  - 1 x daily - 7 x weekly - 2 sets - 10 reps - Supine Bridge  - 1 x daily - 7 x weekly - 2 sets - 10 reps - Supine Hamstring Stretch with Strap  - 3 x daily - 7 x weekly - 1 sets - 10 reps - 10 hold  Access Code: [email or web address] URL: [email or web address] Date: 09/22/2024 Prepared by: Ozell Mainland  Exercises - Supine Lower Trunk Rotation  - 2 x daily - 7 x weekly - 1 sets - 10 reps - 3 hold - Supine Piriformis Stretch with Foot on Ground  - 2 x daily - 7 x weekly - 1 sets - 5 reps - 30 hold - Supine Double Knee to Chest  Modified  - 2 x daily - 7 x weekly - 1 sets - 10 reps - 3 hold - Prone on Elbows Stretch  - 2 x daily - 7 x weekly - 1 sets - 2 reps - 30-60 hold - Standing Lumbar Extension at Wall - Forearms  - 2 x daily - 7 x weekly - 1 sets - 5 reps - 10 hold - Standing Lumbar Extension  - 1 x daily - 7 x weekly - 1 sets - 10 reps - 3 hold  ASSESSMENT:  CLINICAL IMPRESSION: pnt returns for first visit after eval. States he feels some better and stretches are helping. STG met.Inconsistent radiating pain RT> left. Favors ext vs flexion so asked to ext if radiating pain comes and see if it takes it away. Positive NT on RT - issued for HEP. Progressed HEP    Patient is a 34 y.o. male who was seen today for physical therapy evaluation and treatment for lumbar radiculopathy.  He feels it started in October after lifting something heavy at home.  He reports that he has pain in both legs at times but typically right LE, he has tenderness and an ache in the right lateral shin.  He is very tight in the HS, mild tightness in the calves and piriformis.   He had injections in the back and did a round of prednisone, reports that it helped for a while.  X-rays show DDD at L5-S1 but MD feels it could be some nerve impingement.  OBJECTIVE IMPAIRMENTS: Abnormal gait, cardiopulmonary status limiting activity, decreased activity tolerance, decreased balance, decreased endurance, decreased mobility, difficulty walking, decreased ROM, decreased strength, increased fascial restrictions, impaired perceived functional ability, increased muscle spasms, impaired flexibility, improper body mechanics, postural dysfunction, and pain.   REHAB POTENTIAL: Good  CLINICAL DECISION MAKING: Stable/uncomplicated  EVALUATION COMPLEXITY: Low   GOALS: Goals reviewed with patient? No  SHORT TERM GOALS: Target date: 10/12/24  Independent with initial HEP Baseline: Goal status: met 10/14/24  LONG TERM GOALS: Target date: 12/21/24  Independent  with advanced HEP Baseline:  Goal status: INITIAL  2.  Understand posture and body mechanics Baseline:  Goal status: INITIAL  3.  Improve SLR to 70 degrees  Baseline: 45 degrees Goal status: INITIAL  4.  Report pain decreased 50% Baseline:  Goal status: INITIAL  5.  Improve Lumbar ROM 25% Baseline:  Goal status: INITIAL  PLAN:  PT FREQUENCY: 1x/week  PT DURATION: 12 weeks  PLANNED INTERVENTIONS: 97164- PT Re-evaluation, 97110-Therapeutic exercises, 97530- Therapeutic activity, [id number]- Neuromuscular re-education, 97535- Self Care, [id number]- Manual therapy, [id number]- Electrical stimulation (unattended), 97035- Ultrasound, 02987- Traction (mechanical), Patient/Family education, Taping, Joint mobilization, Spinal mobilization, Cryotherapy, and Moist  heat.  PLAN FOR NEXT SESSION: flexibility, core work, surveyor, minerals   Naftuli Dalsanto,ANGIE, PTA 10/14/2024, 3:25 PM  "

## 2024-10-21 ENCOUNTER — Ambulatory Visit: Admitting: Physical Therapy

## 2024-10-21 ENCOUNTER — Encounter: Payer: Self-pay | Admitting: Physical Therapy

## 2024-10-21 DIAGNOSIS — M62830 Muscle spasm of back: Secondary | ICD-10-CM

## 2024-10-21 DIAGNOSIS — M5416 Radiculopathy, lumbar region: Secondary | ICD-10-CM

## 2024-10-21 DIAGNOSIS — M5459 Other low back pain: Secondary | ICD-10-CM

## 2024-10-21 NOTE — Therapy (Signed)
 " OUTPATIENT PHYSICAL THERAPY THORACOLUMBAR    Patient Name: Darius Foster MRN: [id number] DOB:08-23-1991, 34 y.o., male Today's Date: 10/21/2024  END OF SESSION:  PT End of Session - 10/21/24 1529     Visit Number 3    Date for Recertification  12/21/24    Authorization Type BCBS    PT Start Time 1530    PT Stop Time 1615    PT Time Calculation (min) 45 min    Activity Tolerance Patient tolerated treatment well    Behavior During Therapy [facility] for tasks assessed/performed          Past Medical History:  Diagnosis Date   ADD    Anxiety 10/02/2012   CARDIAC MURMUR    ECHO (-) 2008, saw cards innocent murmur   Past Surgical History:  Procedure Laterality Date   WISDOM TOOTH EXTRACTION     Patient Active Problem List   Diagnosis Date Noted   Annual physical exam 08/20/2024   Splinter hemorrhage of fingernail 05/18/2024   Clubbing of fingers- Congenital 08/20/2023   Primary hypertension 03/19/2023   Erectile dysfunction 03/19/2023   Tinea versicolor 07/03/2021   Anxiety 10/02/2012   Attention deficit disorder 03/26/2007    PCP: Thedora, MD  REFERRING PROVIDER: Thedora, MD  REFERRING DIAG: sciatica  Rationale for Evaluation and Treatment: Rehabilitation  THERAPY DIAG:  Other low back pain  Radiculopathy, lumbar region  Muscle spasm of back  ONSET DATE: 08/20/24  SUBJECTIVE:                                                                                                                                                                                           SUBJECTIVE STATEMENT: Still doing pretty good, the stretching helps, standing still will increase the pain Patient reports that he has had some off and on pain in the legs, reports that about 6 months ago he was lifting some heavy things at home and has had some back pain and bilateral leg pain.  PERTINENT HISTORY:  ADD, anxiety  PAIN:  Are you having pain? Yes: NPRS scale: 3/10 Pain location: low back  and into legs, right shin is sore to touch Pain description: ache Aggravating factors: standing, lifting, end of day pain up to 9/10 Relieving factors: advil, changes in position, rest prednisone  at best pain a 1-2/10  PRECAUTIONS: None  RED FLAGS: None   WEIGHT BEARING RESTRICTIONS: No  FALLS:  Has patient fallen in last 6 months? No  LIVING ENVIRONMENT: Lives with: lives with their family Lives in: House/apartment Stairs: No Has following equipment at home: None  OCCUPATION: Welder  PLOF: Independent yard  work, housework  PATIENT GOALS: have less pain  NEXT MD VISIT: 3 months  OBJECTIVE:  Note: Objective measures were completed at Evaluation unless otherwise noted.  DIAGNOSTIC FINDINGS:  X-rays showed DDD L5-S1  COGNITION: Overall cognitive status: Within functional limits for tasks assessed     SENSATION: WFL Tender int he right lateral shin  MUSCLE LENGTH: SLR 45 degrees with pain Tight piriformis  POSTURE: rounded shoulders, forward head, and decreased lumbar lordosis  PALPATION: Tightness in the low back, tender right lateral shin  LUMBAR ROM:   AROM eval 10/14/24  Flexion Decreased 50% Decreased 25%  Extension Decreased 25% Decreased 25%  Right lateral flexion Decreased 25% Decreased 25%  Left lateral flexion Decreased 25% Decreased 25%  Right rotation    Left rotation     (Blank rows = not tested)  LOWER EXTREMITY ROM:     Very tight mms in the LE's but ROM WFL's  LOWER EXTREMITY MMT:   WFL's but reports thjat when he hurts he gives to pain LUMBAR SPECIAL TESTS:  Prone instability test: Negative, Straight leg raise test: Positive, and SI Compression/distraction test: Negative  GAIT: Distance walked: 100 feet Assistive device utilized: None Level of assistance: Complete Independence Comments: a little stiff in the pelvis  TREATMENT DATE:  10/21/24 Nustep level 5 x 6 minutes 15# straight arm pulls 20# AR press 35# lats 35# knee  curls 10# knee extension Slant board stretch Leg press 40# 2x10 10# hip extension 10# hip abduction Passive stretch to the LE's Manual sheet traction and then how to do at home  10/14/24 Nustep L 5 5 min Goals and AROM addressed- see documentation  Feet on ball bridge, KTC and obl Supine isometric abdominals upper and lower with ball 10 x each PROM LE and trunk ( supine and prone) NT stetching RT LE which was positive and showed to do at home Prone opp arm and leg 20x Prone superman 10 x STS with wt ball OH press 10 x Educ on ext when radiating pain comes to see if ext takes it away as he favors ext mvmt Green tband shld ext and row 15x standing Green tband hip ext and abd 2 set 10 standing Standing trunk ext 10x with green tband Bridge Added to HEP  09/22/24 Evaluation, HEP                                                                                                                                 PATIENT EDUCATION:  Education details: POC/HEP Person educated: Patient Education method: Programmer, Multimedia, Demonstration, Actor cues, Verbal cues, and Handouts Education comprehension: verbalized understanding  HOME EXERCISE PROGRAM: Access Code: [email or web address] URL: [email or web address] Date: 10/14/2024 Prepared by: Angela Payseur  Exercises - Shoulder extension with resistance - Neutral  - 1 x daily - 7 x weekly - 2 sets - 10 reps - Standing Shoulder Row with Anchored Resistance  - 1 x daily - 7 x weekly -  2 sets - 10 reps - Hip Extension with Resistance Loop  - 1 x daily - 7 x weekly - 2 sets - 10 reps - Hip Abduction with Resistance Loop  - 1 x daily - 7 x weekly - 2 sets - 10 reps - Full Superman on Table  - 1 x daily - 7 x weekly - 2 sets - 10 reps - Prone Alternating Arm and Leg Lifts  - 1 x daily - 7 x weekly - 2 sets - 10 reps - Supine Bridge  - 1 x daily - 7 x weekly - 2 sets - 10 reps - Supine Hamstring Stretch with Strap  - 3 x daily - 7 x weekly - 1 sets  - 10 reps - 10 hold  Access Code: [email or web address] URL: [email or web address] Date: 09/22/2024 Prepared by: Ozell Mainland  Exercises - Supine Lower Trunk Rotation  - 2 x daily - 7 x weekly - 1 sets - 10 reps - 3 hold - Supine Piriformis Stretch with Foot on Ground  - 2 x daily - 7 x weekly - 1 sets - 5 reps - 30 hold - Supine Double Knee to Chest Modified  - 2 x daily - 7 x weekly - 1 sets - 10 reps - 3 hold - Prone on Elbows Stretch  - 2 x daily - 7 x weekly - 1 sets - 2 reps - 30-60 hold - Standing Lumbar Extension at Wall - Forearms  - 2 x daily - 7 x weekly - 1 sets - 5 reps - 10 hold - Standing Lumbar Extension  - 1 x daily - 7 x weekly - 1 sets - 10 reps - 3 hold  ASSESSMENT:  CLINICAL IMPRESSION: Patient continues to report that he is feeling better overall, thinks the stretches help, he is very tight in the HS and calves, demo of the calf stretch for home as well as sheet traction.  Cues needed for some of the exercises for posture and core activation.  Reports that he still has tingling in the calves with prolonged standing   Patient is a 34 y.o. male who was seen today for physical therapy evaluation and treatment for lumbar radiculopathy.  He feels it started in October after lifting something heavy at home.  He reports that he has pain in both legs at times but typically right LE, he has tenderness and an ache in the right lateral shin.  He is very tight in the HS, mild tightness in the calves and piriformis.   He had injections in the back and did a round of prednisone, reports that it helped for a while.  X-rays show DDD at L5-S1 but MD feels it could be some nerve impingement.  OBJECTIVE IMPAIRMENTS: Abnormal gait, cardiopulmonary status limiting activity, decreased activity tolerance, decreased balance, decreased endurance, decreased mobility, difficulty walking, decreased ROM, decreased strength, increased fascial restrictions, impaired perceived functional ability,  increased muscle spasms, impaired flexibility, improper body mechanics, postural dysfunction, and pain.   REHAB POTENTIAL: Good  CLINICAL DECISION MAKING: Stable/uncomplicated  EVALUATION COMPLEXITY: Low   GOALS: Goals reviewed with patient? No  SHORT TERM GOALS: Target date: 10/12/24  Independent with initial HEP Baseline: Goal status: met 10/14/24  LONG TERM GOALS: Target date: 12/21/24  Independent with advanced HEP Baseline:  Goal status: INITIAL  2.  Understand posture and body mechanics Baseline:  Goal status: progressing 10/21/24  3.  Improve SLR to 70 degrees  Baseline: 45 degrees Goal status: ongoing 10/21/24  4.  Report pain decreased 50% Baseline:  Goal status: INITIAL  5.  Improve Lumbar ROM 25% Baseline:  Goal status: INITIAL  PLAN:  PT FREQUENCY: 1x/week  PT DURATION: 12 weeks  PLANNED INTERVENTIONS: 97164- PT Re-evaluation, 97110-Therapeutic exercises, 97530- Therapeutic activity, [id number]- Neuromuscular re-education, 97535- Self Care, 02859- Manual therapy, [id number]- Electrical stimulation (unattended), 97035- Ultrasound, 02987- Traction (mechanical), Patient/Family education, Taping, Joint mobilization, Spinal mobilization, Cryotherapy, and Moist heat.  PLAN FOR NEXT SESSION: flexibility, core work, surveyor, minerals   OBADIAH OZELL ORN, PT 10/21/2024, 3:30 PM  "

## 2024-10-28 ENCOUNTER — Ambulatory Visit: Admitting: Physical Therapy

## 2024-11-04 ENCOUNTER — Ambulatory Visit: Admitting: Physical Therapy

## 2024-11-29 ENCOUNTER — Ambulatory Visit: Admitting: Family Medicine
